# Patient Record
Sex: Male | Born: 1935 | Race: White | Hispanic: No | Marital: Married | Smoking: Current some day smoker
Health system: Southern US, Community
[De-identification: ages and names within clinical notes are randomized; demographics above are authoritative.]

## PROBLEM LIST (undated history)

## (undated) DIAGNOSIS — M1990 Unspecified osteoarthritis, unspecified site: Secondary | ICD-10-CM

## (undated) DIAGNOSIS — C801 Malignant (primary) neoplasm, unspecified: Secondary | ICD-10-CM

## (undated) DIAGNOSIS — Z923 Personal history of irradiation: Secondary | ICD-10-CM

## (undated) DIAGNOSIS — I10 Essential (primary) hypertension: Secondary | ICD-10-CM

## (undated) DIAGNOSIS — I739 Peripheral vascular disease, unspecified: Secondary | ICD-10-CM

## (undated) HISTORY — PX: ACHILLES TENDON SURGERY: SHX542

---

## 1997-10-28 ENCOUNTER — Other Ambulatory Visit: Admission: RE | Admit: 1997-10-28 | Discharge: 1997-10-28 | Payer: Self-pay | Admitting: *Deleted

## 1998-06-01 ENCOUNTER — Emergency Department (HOSPITAL_COMMUNITY): Admission: EM | Admit: 1998-06-01 | Discharge: 1998-06-01 | Payer: Self-pay | Admitting: Emergency Medicine

## 1998-11-08 ENCOUNTER — Ambulatory Visit (HOSPITAL_BASED_OUTPATIENT_CLINIC_OR_DEPARTMENT_OTHER): Admission: RE | Admit: 1998-11-08 | Discharge: 1998-11-08 | Payer: Self-pay | Admitting: Plastic Surgery

## 2000-03-07 ENCOUNTER — Encounter: Admission: RE | Admit: 2000-03-07 | Discharge: 2000-03-07 | Payer: Self-pay | Admitting: *Deleted

## 2000-03-07 ENCOUNTER — Encounter: Payer: Self-pay | Admitting: *Deleted

## 2000-11-23 ENCOUNTER — Encounter: Admission: RE | Admit: 2000-11-23 | Discharge: 2000-11-23 | Payer: Self-pay | Admitting: Internal Medicine

## 2000-11-23 ENCOUNTER — Encounter: Payer: Self-pay | Admitting: Internal Medicine

## 2001-05-31 ENCOUNTER — Encounter: Admission: RE | Admit: 2001-05-31 | Discharge: 2001-05-31 | Payer: Self-pay | Admitting: Internal Medicine

## 2001-05-31 ENCOUNTER — Encounter: Payer: Self-pay | Admitting: Internal Medicine

## 2001-08-15 ENCOUNTER — Encounter: Admission: RE | Admit: 2001-08-15 | Discharge: 2001-08-15 | Payer: Self-pay | Admitting: Internal Medicine

## 2001-08-15 ENCOUNTER — Encounter: Payer: Self-pay | Admitting: Internal Medicine

## 2004-11-14 ENCOUNTER — Encounter: Admission: RE | Admit: 2004-11-14 | Discharge: 2004-11-14 | Payer: Self-pay | Admitting: Internal Medicine

## 2011-03-11 ENCOUNTER — Emergency Department (HOSPITAL_COMMUNITY): Payer: Medicare Other

## 2011-03-11 ENCOUNTER — Inpatient Hospital Stay (HOSPITAL_COMMUNITY): Payer: Medicare Other

## 2011-03-11 ENCOUNTER — Inpatient Hospital Stay (HOSPITAL_COMMUNITY)
Admission: EM | Admit: 2011-03-11 | Discharge: 2011-03-16 | DRG: 603 | Disposition: A | Discharge status: Skilled Nursing Facility | Payer: Medicare Other | Attending: Internal Medicine | Admitting: Internal Medicine

## 2011-03-11 DIAGNOSIS — M25559 Pain in unspecified hip: Secondary | ICD-10-CM | POA: Diagnosis present

## 2011-03-11 DIAGNOSIS — F17200 Nicotine dependence, unspecified, uncomplicated: Secondary | ICD-10-CM | POA: Diagnosis present

## 2011-03-11 DIAGNOSIS — I10 Essential (primary) hypertension: Secondary | ICD-10-CM | POA: Diagnosis present

## 2011-03-11 DIAGNOSIS — I739 Peripheral vascular disease, unspecified: Secondary | ICD-10-CM | POA: Diagnosis present

## 2011-03-11 DIAGNOSIS — I872 Venous insufficiency (chronic) (peripheral): Secondary | ICD-10-CM | POA: Diagnosis present

## 2011-03-11 DIAGNOSIS — L03119 Cellulitis of unspecified part of limb: Principal | ICD-10-CM | POA: Diagnosis present

## 2011-03-11 DIAGNOSIS — D72829 Elevated white blood cell count, unspecified: Secondary | ICD-10-CM | POA: Diagnosis present

## 2011-03-11 DIAGNOSIS — L97309 Non-pressure chronic ulcer of unspecified ankle with unspecified severity: Secondary | ICD-10-CM | POA: Diagnosis present

## 2011-03-11 DIAGNOSIS — N179 Acute kidney failure, unspecified: Secondary | ICD-10-CM | POA: Diagnosis present

## 2011-03-11 DIAGNOSIS — H3530 Unspecified macular degeneration: Secondary | ICD-10-CM | POA: Diagnosis present

## 2011-03-11 DIAGNOSIS — Z79899 Other long term (current) drug therapy: Secondary | ICD-10-CM

## 2011-03-11 DIAGNOSIS — E876 Hypokalemia: Secondary | ICD-10-CM | POA: Diagnosis present

## 2011-03-11 DIAGNOSIS — L02419 Cutaneous abscess of limb, unspecified: Principal | ICD-10-CM | POA: Diagnosis present

## 2011-03-11 LAB — BASIC METABOLIC PANEL
BUN: 59 mg/dL — ABNORMAL HIGH (ref 6–23)
CO2: 25 mEq/L (ref 19–32)
Calcium: 10.9 mg/dL — ABNORMAL HIGH (ref 8.4–10.5)
Chloride: 99 mEq/L (ref 96–112)
Creatinine, Ser: 1.62 mg/dL — ABNORMAL HIGH (ref 0.50–1.35)
Glucose, Bld: 91 mg/dL (ref 70–99)
Potassium: 4.1 mEq/L (ref 3.5–5.1)
Sodium: 136 mEq/L (ref 135–145)

## 2011-03-11 LAB — URINALYSIS, ROUTINE W REFLEX MICROSCOPIC
Bilirubin Urine: NEGATIVE
Glucose, UA: NEGATIVE mg/dL
Specific Gravity, Urine: 1.019 (ref 1.005–1.030)
Urobilinogen, UA: 1 mg/dL (ref 0.0–1.0)
pH: 7.5 (ref 5.0–8.0)

## 2011-03-11 LAB — URINE MICROSCOPIC-ADD ON

## 2011-03-11 LAB — CBC
HCT: 38.6 % — ABNORMAL LOW (ref 39.0–52.0)
Hemoglobin: 13.3 g/dL (ref 13.0–17.0)
MCH: 34.5 pg — ABNORMAL HIGH (ref 26.0–34.0)
MCHC: 34.5 g/dL (ref 30.0–36.0)
RBC: 3.86 MIL/uL — ABNORMAL LOW (ref 4.22–5.81)
WBC: 22.6 10*3/uL — ABNORMAL HIGH (ref 4.0–10.5)

## 2011-03-11 LAB — DIFFERENTIAL
Basophils Absolute: 0 10*3/uL (ref 0.0–0.1)
Eosinophils Relative: 0 % (ref 0–5)
Lymphocytes Relative: 3 % — ABNORMAL LOW (ref 12–46)
Lymphs Abs: 0.7 10*3/uL (ref 0.7–4.0)
Monocytes Relative: 4 % (ref 3–12)
WBC Morphology: INCREASED

## 2011-03-12 LAB — CBC
MCH: 34.9 pg — ABNORMAL HIGH (ref 26.0–34.0)
MCV: 99.2 fL (ref 78.0–100.0)
Platelets: 161 10*3/uL (ref 150–400)
RBC: 3.67 MIL/uL — ABNORMAL LOW (ref 4.22–5.81)
RDW: 13.7 % (ref 11.5–15.5)
WBC: 24.6 10*3/uL — ABNORMAL HIGH (ref 4.0–10.5)

## 2011-03-12 LAB — DIFFERENTIAL
Basophils Absolute: 0 10*3/uL (ref 0.0–0.1)
Eosinophils Absolute: 0 10*3/uL (ref 0.0–0.7)
Lymphocytes Relative: 6 % — ABNORMAL LOW (ref 12–46)
Monocytes Relative: 3 % (ref 3–12)
Neutro Abs: 22.4 10*3/uL — ABNORMAL HIGH (ref 1.7–7.7)
WBC Morphology: INCREASED

## 2011-03-12 LAB — PROCALCITONIN: Procalcitonin: 20.99 ng/mL

## 2011-03-13 ENCOUNTER — Inpatient Hospital Stay (HOSPITAL_COMMUNITY): Payer: Medicare Other

## 2011-03-13 LAB — CBC
MCH: 34.5 pg — ABNORMAL HIGH (ref 26.0–34.0)
MCHC: 34.6 g/dL (ref 30.0–36.0)
Platelets: 166 10*3/uL (ref 150–400)
RBC: 3.57 MIL/uL — ABNORMAL LOW (ref 4.22–5.81)

## 2011-03-13 LAB — BASIC METABOLIC PANEL
Calcium: 10.4 mg/dL (ref 8.4–10.5)
GFR calc Af Amer: 46 mL/min — ABNORMAL LOW (ref >90)
GFR calc non Af Amer: 40 mL/min — ABNORMAL LOW (ref >90)
Potassium: 3 mEq/L — ABNORMAL LOW (ref 3.5–5.1)
Sodium: 138 mEq/L (ref 135–145)

## 2011-03-14 ENCOUNTER — Inpatient Hospital Stay (HOSPITAL_COMMUNITY): Payer: Medicare Other

## 2011-03-14 LAB — URINALYSIS, ROUTINE W REFLEX MICROSCOPIC
Bilirubin Urine: NEGATIVE
Leukocytes, UA: NEGATIVE
Nitrite: NEGATIVE
Specific Gravity, Urine: 1.017 (ref 1.005–1.030)
Urobilinogen, UA: 0.2 mg/dL (ref 0.0–1.0)

## 2011-03-14 LAB — URINE MICROSCOPIC-ADD ON

## 2011-03-14 LAB — BASIC METABOLIC PANEL
CO2: 26 mEq/L (ref 19–32)
Calcium: 10.7 mg/dL — ABNORMAL HIGH (ref 8.4–10.5)
Creatinine, Ser: 1.19 mg/dL (ref 0.50–1.35)

## 2011-03-14 LAB — CBC
MCH: 34.1 pg — ABNORMAL HIGH (ref 26.0–34.0)
MCHC: 34.6 g/dL (ref 30.0–36.0)
MCV: 98.7 fL (ref 78.0–100.0)
Platelets: 175 10*3/uL (ref 150–400)
RBC: 3.72 MIL/uL — ABNORMAL LOW (ref 4.22–5.81)

## 2011-03-14 NOTE — H&P (Signed)
NAMEFROILAN, [name] NO.:  [id number]  MEDICAL RECORD NO.:  [id number]  LOCATION:  [facility]                         FACILITY:  [facility]  PHYSICIAN:  Candelaria Celeste, DO      DATE OF BIRTH:  05/20/1917  DATE OF ADMISSION:  03/11/2011 DATE OF DISCHARGE:                             HISTORY & PHYSICAL   PRIMARY CARE PROVIDER:  Dr. Ludwig Clarks.  CHIEF COMPLAINT:  Left hip pain and left ankle wound.  HISTORY OF PRESENT ILLNESS:  Mr. [name] is a 77 year old male with history of osteopenia, hypertension, chronic venous stasis with a chronic left wound who fell this morning while walking with his wife's walker.  The patient has had difficulty ambulating due to his pain associated with his left ankle wound.  The patient had a mechanical fall and fell on his backside.  He states that he was having difficulty standing up after the fall.  He was specifically complaining of left hip plain.  He denies radiation of the pain.  Currently, the patient admits to mild left discomfort while at rest.  He was able to stand up and put mild pressure on his left leg, although was not able to put full weight due to pain in his left lower leg associated with his chronic left leg wound.  He denied pain to palpation of his left hip.  There is no radiation of his pain.  He denies striking any other part of his body and denies any wounds or bleeding.  In regards to his left lower extremity wound, he has a left medial ankle wound that has been there for several years.  He was recently put on antibiotics yesterday for concerns of cellulitis and wound infection. The patient has had 1 dose.  He denies fevers, shaking chills, nausea, vomiting, worsening of the leg wound.  PAST MEDICAL HISTORY: 1. Hypertension. 2. Chronic venous stasis. 3. Chronic leg wound.  FAMILY HISTORY:  Noncontributory.  MEDICATIONS: 1. Multivitamin. 2. Fish oil. 3. Tylenol extra-strength 1 capsule every 4 hours as  needed. 4. Brimonidine 0.15% one drop to both eyes every 12 hours. 5. Latanoprost 0.005% one drop both eyes daily. 6. Cefixime 400 mg 1 tablet p.o. daily. 7. Niaspan 1000 mg 1 tab p.o. daily. 8. Hydrochlorothiazide 12 mg 1 tab p.o. daily. 9. Furosemide 40 mg 1 tab p.o. daily. 10.Hydralazine 10 mg 1 tab twice daily. 11.Spironolactone 25 mg daily. 12.Amlodipine/benazepril 10/40 mg 1 tab daily.  ALLERGIES:  No known drug allergies.  SOCIAL HISTORY:  The patient is a pipe smoker and currently smokes.  The patient has 1 glass of red wine at night.  REVIEW OF SYSTEMS:  Review of systems is as stated above in the HPI, otherwise negative for 10 systems.  PHYSICAL EXAMINATION:  VITAL SIGNS:  Temperature 99.1, heart rate 86, respiratory rate 20, blood pressure 147/69. GENERAL:  This is an elderly Caucasian male who is awake, alert, and oriented x3.  No acute distress. HEENT:  Head is normocephalic, atraumatic.  Pupils equal, round, and reactive to light.  Extraocular muscles are intact.  Sclerae are anicteric.  Tympanic membranes are translucent with good cone of light. Oropharynx is nonerythematous.  There are no mucosal lesions. NECK:  Supple without lymphadenopathy. LUNGS:  Clear to auscultation bilaterally.  No wheezes, rales, or rhonchi. CARDIAC:  Regular rate with a 1+ systolic murmur heard best over the pulmonary valve.  There is no jugular venous distention.  There is no carotid bruits auscultated. ABDOMEN:  Soft, nontender, nondistended with appropriate bowel sounds. VASCULAR:  2+ radial pulses.  The extremities are warm to touch into the lower extremities bilaterally secondary to chronic venous stasis changes. SKIN:  There is a 1 cm x 2 cm ulceration with flaking necrotic tissue seen on the left medial ankle with flaking skin surrounding it.  There is no erythema noted. NEURO:  Strength is 4/5 in the left hip secondary to pain in the left ankle.  The patient has difficulty  standing secondary to left ankle pain.  LABORATORY DATA: 1. CMP was significant for BUN of 59 and a creatinine of 1.62. 2. CBC showed a white count of 22.6 and 93% neutrophils. 3. Urinalysis was normal. 4. CT of the left hip showed no evidence of fracture. 5. Chest x-ray was negative for acute process.  IMPRESSION: 1. Left hip pain secondary to mechanical fall with no evidence of     fracture. 2. Left ankle wound. 3. Leukocytosis. 4. Acute renal insufficiency. 5. Hypertension. 6. Chronic venous stasis.  PLAN:  We will admit the patient to medical surgical floor.  At this time, despite the patient's negative hip x-ray and negative hip CT, the patient is having some mild-to-moderate pain, we will treat the patient with fentanyl IV 25 mcg every 2 hours as needed, as well as Vicodin for mild-to-moderate pain.  We will also obtain a physical therapy and occupational therapy consult to assess the patient's ability to ambulate.  In regards to the patient's left ankle wound, it has an acute on chronic appearance.  The patient's leukocytosis may be due to smoldering infection in the wound.  We will continue the patient on cefixime as the patient has not been on this medication long enough to evaluate effectiveness or not.  We will get a wound consult to aide in management of the patient's wound.  I will obtain a x- ray of the left lower extremity to rule out osteomyelitis specifically as this appears chronic in nature.  We will continue to monitor the patient's CBC and electrolytes.  The patient is a full code.  We will start the patient on enoxaparin for DVT prophylaxis.          ______________________________ Candelaria Celeste, DO     JS/MEDQ  D:  03/11/2011  T:  03/11/2011  Job:  [id number]  cc:   Dr. Ludwig Clarks  Electronically Signed by Candelaria Celeste DO on 03/14/2011 03:16:20 PM

## 2011-03-16 LAB — COMPREHENSIVE METABOLIC PANEL
Alkaline Phosphatase: 71 U/L (ref 39–117)
BUN: 28 mg/dL — ABNORMAL HIGH (ref 6–23)
Calcium: 10.2 mg/dL (ref 8.4–10.5)
GFR calc Af Amer: 84 mL/min — ABNORMAL LOW (ref >90)
GFR calc non Af Amer: 73 mL/min — ABNORMAL LOW (ref >90)
Glucose, Bld: 116 mg/dL — ABNORMAL HIGH (ref 70–99)
Potassium: 3.7 mEq/L (ref 3.5–5.1)
Total Protein: 6 g/dL (ref 6.0–8.3)

## 2011-03-16 LAB — MAGNESIUM: Magnesium: 1.5 mg/dL (ref 1.5–2.5)

## 2011-03-16 LAB — CBC
HCT: 34.9 % — ABNORMAL LOW (ref 39.0–52.0)
Hemoglobin: 12.1 g/dL — ABNORMAL LOW (ref 13.0–17.0)
MCH: 34.3 pg — ABNORMAL HIGH (ref 26.0–34.0)
MCHC: 34.7 g/dL (ref 30.0–36.0)

## 2011-03-16 LAB — DIFFERENTIAL
Eosinophils Relative: 2 % (ref 0–5)
Lymphs Abs: 1.8 10*3/uL (ref 0.7–4.0)
Monocytes Relative: 7 % (ref 3–12)
Neutro Abs: 8.6 10*3/uL — ABNORMAL HIGH (ref 1.7–7.7)

## 2011-03-18 LAB — CULTURE, BLOOD (ROUTINE X 2)
Culture  Setup Time: 201210281711
Culture: NO GROWTH

## 2011-03-19 LAB — CULTURE, BLOOD (ROUTINE X 2)

## 2011-03-31 NOTE — Discharge Summary (Signed)
  NAMEILAI, HILLER NO.:  [id number]  MEDICAL RECORD NO.:  [id number]  LOCATION:  5501                         FACILITY:  [facility]  PHYSICIAN:  Baltazar Najjar, MD     DATE OF BIRTH:  May 29, 1917  DATE OF ADMISSION:  03/11/2011 DATE OF DISCHARGE:                              DISCHARGE SUMMARY   ADDENDUM:  Blood culture was sent during this hospitalization, remained negative so far.  However, this is a preliminary report.  Final report will need to be followed, defer that to PCP.          ______________________________ Baltazar Najjar, MD     SA/MEDQ  D:  03/16/2011  T:  03/16/2011  Job:  [id number]  cc:   DR. Louretta Parma, MD  Electronically Signed by Hannah Beat MD on 03/31/2011 04:51:11 PM

## 2011-03-31 NOTE — Discharge Summary (Signed)
[name], BUZBY NO.:  [id number]  MEDICAL RECORD NO.:  [id number]  LOCATION:  5501                         FACILITY:  [facility]  PHYSICIAN:  Baltazar Najjar, MD     DATE OF BIRTH:  May 24, 1917  DATE OF ADMISSION:  03/11/2011 DATE OF DISCHARGE:                              DISCHARGE SUMMARY   FINAL DISCHARGE DIAGNOSES: 1. Left ankle ulcers. 2. Chronic bilateral venous stasis. 3. Status post fall. 4. Left hip pain secondary to mechanical fall with no evidence of     fracture. 5. Acute kidney injury, resolved. 6. Hypokalemia, resolved. 7. Leukocytosis, improving. 8. Hypertension.  CONSULTATIONS:  Orthopedic Service.  RADIOLOGY/IMAGING STUDIES: 1. Left hip x-ray showed no acute fracture or subluxation.  Diffuse     osteopenia. 2. Chest x-ray showed no acute infiltrate or edema.  Probable chronic     mild interstitial prominence.  No gross fractures identified. 3. Left hip CT showed no evidence of fracture or dislocation. 4. Left tibia/fibula x-ray showed no acute osseous abnormality     identified about the left tibia and fibula. 5. Chest x-ray October 30 showed stable compared with previous exam. 6. Left foot MRI October 30 showed medial erosion of the head of the     first metatarsal with a borderline appearance for the first     metatarsal joint effusion and a small erosion of the medial     sesamoid.  Gout could cause this appearance.  Erosive arthropathy     such as rheumatoid arthropathy is the differential diagnostic     consideration.  Although there is a small effusion which could go     along with septic joints, the findings are felt to be less     characteristic of septic joint/osteoarthritis. 7. Abnormal subcutaneous edema noted along the dorsum of the foot with     mild edema tracking in the plantar musculature of the foot     cellulitis/myositis cannot be excluded. 8. Degenerative appearing arthropathy along the Lisfranc joint but  without Lisfranc joint subluxation or fracture.  BRIEF ADMITTING HISTORY:  Please refer to H and P for more details.  On summary, [name] is a 77 year old pleasant man with multiple comorbidities presented with left hip pain after he sustained a mechanical fall.  Please refer to H and P for more details.  HOSPITAL COURSE: 1. Left hip pain secondary to mechanical fall.  X-rays/CT of the left     hip showed no evidence of fracture.  The patient was on Lortab     p.r.n. for pain control.  He was seen by PT and recommendation to     discharge to skilled nursing facility. 2. Chronic left ankle wound.  The patient was followed by wound care     consult.  Orthopedic Service was also consulted.  MRI did not show     evidence of osteomyelitis or abscess.  There was suspicion about     possible gout arthropathy.  His uric acid was checked that was     within normal range.  Given leukocytosis, the patient was covered     with Zosyn and vancomycin.  He received 5 days IV.  His white count  dropped from 22,000 to 11.5.  I will discharge him on 5 more days     of Keflex to complete 10 days of antibiotic.  The patient will     follow with Dr. Lajoyce Corners in 1 week as per orthopedic recommendations. 3. Acute kidney injury, resolved.  The patient was on benazepril,     Lasix, Aldactone, and hydrochlorothiazide.  Lasix and benazepril     and hydrochlorothiazide were held.  Initially his creatinine     retained to his baseline, which is normal.  So his acute kidney     injury was most likely secondary to prerenal azotemia. 4. Hypokalemia that was repleted.  The patient was supplemented with     potassium chloride 20 mEq b.i.d.  I will discharge him on potassium     chloride 20 mEq daily.  Since restarting his benazepril and he is     already on Aldactone, his potassium might increase.  He will need     close monitoring of potassium level to see the need for     continuation of potassium  supplementation.  I will defer that to     the rounding physician at the nursing facility and to his PCP.  The     patient will be discharged also on Lasix, so his potassium level     will need to be monitored and potassium dose to be adjusted. 5. Hypertension.  The patient had transient hypotension during this     hospitalization.  He is currently stabilized.  His blood pressure     today is 131/67.  We are resuming his antihypertensive medication     including amlodipine/benazepril.  He is also on Aldactone and     Lasix.  We will hold his HCTZ on discharge. 6. Chronic bilateral venous status changes/left ankle wound.  The     patient will need to continue wound care as an outpatient. 7. The patient seen and examined by me today.  He denies any     complaints, eager for discharge to rehab to get stronger.  DISCHARGE VITAL SIGNS:  Blood pressure 131/67, pulse 58, temperature 97.2.  The patient is stable medically for discharge to skilled nursing facility.  DISCHARGE MEDICATIONS: 1. Hydrocodone/APAP 5/325 mg tablets, 1-2 tablets p.o. q.8 hours     p.r.n. to take for 5 more days as needed. 2. Potassium chloride 20 mEq tablet, take 1 tablet p.o. daily as     instructed by PCP. 3. Keflex 500 mg tablet, take 1 tab p.o. twice daily for 5 more days. 4. Amlodipine/benazepril 10/40 mg 1 tablet p.o. daily. 5. Lasix 40 mg 1 tablet p.o. daily. 6. Latanoprost 0.005% ophthalmic 1 drop in both eyes daily. 7. Multivitamin 1 capsule p.o. daily. 8. Niaspan 1000 mg 1 tablet p.o. daily. 9. Spironolactone 25 mg 1 tablet p.o. daily. 10.Tylenol 500 mg 1 capsule p.o. q.4 hours p.r.n. 11.Brimonidine 0.15%, 1 drop both eyes q.12 hours. 12.Fish oil 1 capsule p.o. daily.  DISCHARGE INSTRUCTIONS: 1. The patient to continue above medications as prescribed. 2. Wound care to be arranged as an outpatient. 3. The patient to follow with Dr. Lajoyce Corners in [facility]     in 1 week of discharge and to  follow with his PCP within 1-2 weeks     of discharge. 4. Monitor potassium level and adjust potassium supplementation.     Deferred to PCP. 5. I will also hold his hydralazine given his transient hypotension.     He  will be discharged on     amlodipine/benazepril, Aldactone, and Lasix.  Hydralazine and HCTZ     will be held.  Please monitor his blood pressure regularly and     adjust medicines as needed.  CONDITION ON DISCHARGE:  Stable.          ______________________________ Baltazar Najjar, MD     SA/MEDQ  D:  03/16/2011  T:  03/16/2011  Job:  [id number]  cc:   Dr. Louretta Parma, MD  Electronically Signed by Hannah Beat MD on 03/31/2011 04:51:04 PM

## 2013-08-22 ENCOUNTER — Emergency Department (HOSPITAL_COMMUNITY): Payer: Medicare Other

## 2013-08-22 ENCOUNTER — Encounter (HOSPITAL_COMMUNITY): Payer: Self-pay | Admitting: Oncology

## 2013-08-22 ENCOUNTER — Inpatient Hospital Stay (HOSPITAL_COMMUNITY)
Admission: EM | Admit: 2013-08-22 | Discharge: 2013-08-24 | DRG: 864 | Disposition: A | Discharge status: Home Health | Payer: Medicare Other | Attending: Internal Medicine | Admitting: Internal Medicine

## 2013-08-22 DIAGNOSIS — R5381 Other malaise: Secondary | ICD-10-CM | POA: Diagnosis present

## 2013-08-22 DIAGNOSIS — R350 Frequency of micturition: Secondary | ICD-10-CM | POA: Diagnosis present

## 2013-08-22 DIAGNOSIS — R300 Dysuria: Secondary | ICD-10-CM | POA: Diagnosis present

## 2013-08-22 DIAGNOSIS — I10 Essential (primary) hypertension: Secondary | ICD-10-CM | POA: Diagnosis present

## 2013-08-22 DIAGNOSIS — Z79899 Other long term (current) drug therapy: Secondary | ICD-10-CM

## 2013-08-22 DIAGNOSIS — R5383 Other fatigue: Secondary | ICD-10-CM

## 2013-08-22 DIAGNOSIS — L02419 Cutaneous abscess of limb, unspecified: Secondary | ICD-10-CM | POA: Diagnosis present

## 2013-08-22 DIAGNOSIS — L03119 Cellulitis of unspecified part of limb: Secondary | ICD-10-CM

## 2013-08-22 DIAGNOSIS — Z8551 Personal history of malignant neoplasm of bladder: Secondary | ICD-10-CM

## 2013-08-22 DIAGNOSIS — E785 Hyperlipidemia, unspecified: Secondary | ICD-10-CM | POA: Diagnosis present

## 2013-08-22 DIAGNOSIS — M549 Dorsalgia, unspecified: Secondary | ICD-10-CM | POA: Diagnosis present

## 2013-08-22 DIAGNOSIS — I872 Venous insufficiency (chronic) (peripheral): Secondary | ICD-10-CM | POA: Diagnosis present

## 2013-08-22 DIAGNOSIS — R509 Fever, unspecified: Secondary | ICD-10-CM

## 2013-08-22 DIAGNOSIS — A419 Sepsis, unspecified organism: Secondary | ICD-10-CM

## 2013-08-22 DIAGNOSIS — R7881 Bacteremia: Secondary | ICD-10-CM

## 2013-08-22 DIAGNOSIS — D72829 Elevated white blood cell count, unspecified: Secondary | ICD-10-CM

## 2013-08-22 DIAGNOSIS — R531 Weakness: Secondary | ICD-10-CM

## 2013-08-22 DIAGNOSIS — R109 Unspecified abdominal pain: Secondary | ICD-10-CM | POA: Diagnosis present

## 2013-08-22 HISTORY — DX: Malignant (primary) neoplasm, unspecified: C80.1

## 2013-08-22 HISTORY — DX: Unspecified osteoarthritis, unspecified site: M19.90

## 2013-08-22 HISTORY — DX: Essential (primary) hypertension: I10

## 2013-08-22 LAB — URINALYSIS, ROUTINE W REFLEX MICROSCOPIC
BILIRUBIN URINE: NEGATIVE
GLUCOSE, UA: NEGATIVE mg/dL
Hgb urine dipstick: NEGATIVE
Ketones, ur: NEGATIVE mg/dL
LEUKOCYTES UA: NEGATIVE
NITRITE: NEGATIVE
PH: 8 (ref 5.0–8.0)
Protein, ur: NEGATIVE mg/dL
SPECIFIC GRAVITY, URINE: 1.016 (ref 1.005–1.030)
Urobilinogen, UA: 1 mg/dL (ref 0.0–1.0)

## 2013-08-22 LAB — CBC
HEMATOCRIT: 32.6 % — AB (ref 39.0–52.0)
HEMOGLOBIN: 11 g/dL — AB (ref 13.0–17.0)
MCH: 32.8 pg (ref 26.0–34.0)
MCHC: 33.7 g/dL (ref 30.0–36.0)
MCV: 97.3 fL (ref 78.0–100.0)
Platelets: 191 10*3/uL (ref 150–400)
RBC: 3.35 MIL/uL — AB (ref 4.22–5.81)
RDW: 14.7 % (ref 11.5–15.5)
WBC: 23.5 10*3/uL — ABNORMAL HIGH (ref 4.0–10.5)

## 2013-08-22 LAB — COMPREHENSIVE METABOLIC PANEL
ALK PHOS: 97 U/L (ref 39–117)
ALT: 14 U/L (ref 0–53)
AST: 24 U/L (ref 0–37)
Albumin: 3.1 g/dL — ABNORMAL LOW (ref 3.5–5.2)
BILIRUBIN TOTAL: 0.6 mg/dL (ref 0.3–1.2)
BUN: 23 mg/dL (ref 6–23)
CHLORIDE: 99 meq/L (ref 96–112)
CO2: 24 meq/L (ref 19–32)
Calcium: 9.4 mg/dL (ref 8.4–10.5)
Creatinine, Ser: 1.29 mg/dL (ref 0.50–1.35)
GFR, EST AFRICAN AMERICAN: 53 mL/min — AB (ref >90)
GFR, EST NON AFRICAN AMERICAN: 46 mL/min — AB (ref >90)
GLUCOSE: 138 mg/dL — AB (ref 70–99)
POTASSIUM: 4.2 meq/L (ref 3.7–5.3)
SODIUM: 134 meq/L — AB (ref 137–147)
Total Protein: 6.4 g/dL (ref 6.0–8.3)

## 2013-08-22 LAB — I-STAT CG4 LACTIC ACID, ED: LACTIC ACID, VENOUS: 1.51 mmol/L (ref 0.5–2.2)

## 2013-08-22 LAB — TSH: TSH: 1.03 u[IU]/mL (ref 0.350–4.500)

## 2013-08-22 MED ORDER — ENOXAPARIN SODIUM 40 MG/0.4ML ~~LOC~~ SOLN
40.0000 mg | SUBCUTANEOUS | Status: DC
  Administered 2013-08-23: 40 mg via SUBCUTANEOUS
  Filled 2013-08-22 (×2): qty 0.4

## 2013-08-22 MED ORDER — SODIUM CHLORIDE 0.9 % IV SOLN
INTRAVENOUS | Status: DC
  Administered 2013-08-22: via INTRAVENOUS

## 2013-08-22 MED ORDER — OMEGA-3-ACID ETHYL ESTERS 1 G PO CAPS
1.0000 g | ORAL_CAPSULE | Freq: Every day | ORAL | Status: DC
  Administered 2013-08-23 – 2013-08-24 (×2): 1 g via ORAL
  Filled 2013-08-22 (×2): qty 1

## 2013-08-22 MED ORDER — ACETAMINOPHEN 500 MG PO TABS
1000.0000 mg | ORAL_TABLET | Freq: Once | ORAL | Status: AC
  Administered 2013-08-22: 1000 mg via ORAL
  Filled 2013-08-22: qty 2

## 2013-08-22 MED ORDER — SODIUM CHLORIDE 0.9 % IV BOLUS (SEPSIS)
500.0000 mL | Freq: Once | INTRAVENOUS | Status: AC
  Administered 2013-08-22: 500 mL via INTRAVENOUS

## 2013-08-22 MED ORDER — ONDANSETRON HCL 4 MG/2ML IJ SOLN: 4.0000 mg | Freq: Four times a day (QID) | INTRAMUSCULAR | Status: DC | PRN

## 2013-08-22 MED ORDER — SENNOSIDES-DOCUSATE SODIUM 8.6-50 MG PO TABS: 1.0000 | ORAL_TABLET | Freq: Every evening | ORAL | Status: DC | PRN

## 2013-08-22 MED ORDER — SODIUM CHLORIDE 0.9 % IV BOLUS (SEPSIS): 1000.0000 mL | Freq: Once | INTRAVENOUS | Status: DC

## 2013-08-22 MED ORDER — ACETAMINOPHEN 650 MG RE SUPP: 650.0000 mg | Freq: Four times a day (QID) | RECTAL | Status: DC | PRN

## 2013-08-22 MED ORDER — LATANOPROST 0.005 % OP SOLN
1.0000 [drp] | Freq: Every day | OPHTHALMIC | Status: DC
  Administered 2013-08-23 (×2): 1 [drp] via OPHTHALMIC
  Filled 2013-08-22: qty 2.5

## 2013-08-22 MED ORDER — OXYCODONE HCL 5 MG PO TABS
5.0000 mg | ORAL_TABLET | ORAL | Status: DC | PRN
  Administered 2013-08-23 (×2): 5 mg via ORAL
  Filled 2013-08-22 (×2): qty 1

## 2013-08-22 MED ORDER — VANCOMYCIN HCL IN DEXTROSE 1-5 GM/200ML-% IV SOLN
1000.0000 mg | Freq: Once | INTRAVENOUS | Status: AC
  Administered 2013-08-22: 1000 mg via INTRAVENOUS
  Filled 2013-08-22: qty 200

## 2013-08-22 MED ORDER — SODIUM CHLORIDE 0.9 % IV SOLN
INTRAVENOUS | Status: AC
  Administered 2013-08-22: 18:00:00 via INTRAVENOUS
  Administered 2013-08-22: 500 mL via INTRAVENOUS

## 2013-08-22 MED ORDER — PNEUMOCOCCAL VAC POLYVALENT 25 MCG/0.5ML IJ INJ
0.5000 mL | INJECTION | INTRAMUSCULAR | Status: AC
  Administered 2013-08-23: 0.5 mL via INTRAMUSCULAR
  Filled 2013-08-22 (×2): qty 0.5

## 2013-08-22 MED ORDER — ONDANSETRON HCL 4 MG PO TABS: 4.0000 mg | ORAL_TABLET | Freq: Four times a day (QID) | ORAL | Status: DC | PRN

## 2013-08-22 MED ORDER — CLOPIDOGREL BISULFATE 75 MG PO TABS
75.0000 mg | ORAL_TABLET | Freq: Every day | ORAL | Status: DC
  Administered 2013-08-23 – 2013-08-24 (×2): 75 mg via ORAL
  Filled 2013-08-22 (×3): qty 1

## 2013-08-22 MED ORDER — PIPERACILLIN-TAZOBACTAM 3.375 G IVPB 30 MIN
3.3750 g | Freq: Once | INTRAVENOUS | Status: AC
  Administered 2013-08-22: 3.375 g via INTRAVENOUS
  Filled 2013-08-22: qty 50

## 2013-08-22 MED ORDER — ACETAMINOPHEN 325 MG PO TABS
650.0000 mg | ORAL_TABLET | Freq: Four times a day (QID) | ORAL | Status: DC | PRN
  Administered 2013-08-23: 650 mg via ORAL
  Filled 2013-08-22: qty 2

## 2013-08-22 MED ORDER — BRIMONIDINE TARTRATE 0.15 % OP SOLN
1.0000 [drp] | Freq: Two times a day (BID) | OPHTHALMIC | Status: DC
  Administered 2013-08-23 – 2013-08-24 (×4): 1 [drp] via OPHTHALMIC
  Filled 2013-08-22: qty 5

## 2013-08-22 MED ORDER — NIACIN ER (ANTIHYPERLIPIDEMIC) 500 MG PO TBCR
1000.0000 mg | EXTENDED_RELEASE_TABLET | Freq: Every day | ORAL | Status: DC
  Administered 2013-08-23 (×2): 1000 mg via ORAL
  Filled 2013-08-22 (×3): qty 2

## 2013-08-22 NOTE — ED Notes (Addendum)
Per EMS patient from home, home caregiver saw patient this morning and patient was "fine," ambulatory, approximately 2 hours ago patient had a fever and was not able to ambulate. Per EMS patient had urinary frequency prior to fever. Per EMS patient is A & O, difficulty hearing. Per EMS patient does not have AMS. Patient fell 2 weeks ago and was in [place] for rehab, for which he still has hip and back pain.

## 2013-08-22 NOTE — Progress Notes (Signed)
   CARE MANAGEMENT ED NOTE 08/22/2013  Patient:  [name], [name]   Account Number:  [id number]  Date Initiated:  08/22/2013  Documentation initiated by:  Livia Snellen  Subjective/Objective Assessment:   Patient presented to Ed with fever and unable to ambulate.     Subjective/Objective Assessment Detail:   WBC 23.5  temp 102.4     Action/Plan:   Action/Plan Detail:   Anticipated DC Date:       Status Recommendation to Physician:   Result of Recommendation:    Other ED [place]  Other  PCP issues    Choice offered to / List presented to:            Status of service:  Completed, signed off  ED Comments:   ED Comments Detail:  EDCM spoke to patient and his daughter Sandria Bales (patient's POA) at bedside.  Gloria's phone number (h) [phone] and (c) [phone].  Patient lives at home alone.  Patient reports, "I'm alone at night."  Patient's daughter reports, patient has an aide from [place] three hours in the am and three hours in the pm who help the patient with meals, ADL's and cleaning the house. Patient has a walker, cane, shower chair and bedside commode at home.  Patient has just been discharged from the [facility] in [place] on 03/21 or 03/23 per patient's daughter.  Patient is receiving home health sevices  RN, PT, OT with [facility] per patient's daughter.  No further EDCM needs at this time.

## 2013-08-22 NOTE — ED Provider Notes (Signed)
CSN: [id number]     Arrival date & time 08/22/13  1518 History   First MD Initiated Contact with Patient 08/22/13 1537     Chief Complaint  Patient presents with  . Fever     (Consider location/radiation/quality/duration/timing/severity/associated sxs/prior Treatment) The history is provided by the patient and a relative. The history is limited by the condition of the patient.  [name] is a 80 y.o. male hx of dementia, recent admission at [facility] for bacteremia here with fever. He was recently a med several weeks ago for bacteremia and went to rehab. He just went home for several days and lives alone at home. A caregiver came today and noticed that he had a fever and hasn't been able to ambulate. He also has urinary frequency as well. He is confused, unable to give much history.   Level V caveat- dementia    No past medical history on file. No past surgical history on file. No family history on file. History  Substance Use Topics  . Smoking status: Not on file  . Smokeless tobacco: Not on file  . Alcohol Use: Not on file    Review of Systems  Constitutional: Positive for fever.  Neurological: Positive for weakness.  All other systems reviewed and are negative.     Allergies  Review of patient's allergies indicates no known allergies.  Home Medications   Current Outpatient Rx  Name  Route  Sig  Dispense  Refill  . amLODipine-benazepril (LOTREL) 10-40 MG per capsule   Oral   Take 1 capsule by mouth daily.         . brimonidine (ALPHAGAN) 0.15 % ophthalmic solution   Both Eyes   Place 1 drop into both eyes 2 (two) times daily.         . clopidogrel (PLAVIX) 75 MG tablet   Oral   Take 75 mg by mouth daily with breakfast.         . finasteride (PROSCAR) 5 MG tablet   Oral   Take 5 mg by mouth daily.         . furosemide (LASIX) 20 MG tablet   Oral   Take 20 mg by mouth daily.         . hydrALAZINE (APRESOLINE) 10 MG tablet   Oral  Take 5 mg by mouth 2 (two) times daily. TAKE 1/2 TABLET         . latanoprost (XALATAN) 0.005 % ophthalmic solution   Both Eyes   Place 1 drop into both eyes at bedtime.         . niacin (NIASPAN) 500 MG CR tablet   Oral   Take 1,000 mg by mouth at bedtime.         Marland Kitchen omega-3 acid ethyl esters (LOVAZA) 1 G capsule   Oral   Take 1 g by mouth daily.         . tamsulosin (FLOMAX) 0.4 MG CAPS capsule   Oral   Take 0.4 mg by mouth daily after supper.         . traMADol (ULTRAM) 50 MG tablet   Oral   Take 50 mg by mouth every 6 (six) hours as needed (PAIN).          BP 112/54  Pulse 90  Temp(Src) 102.4 F (39.1 C) (Rectal)  Resp 18  SpO2 97% Physical Exam  Nursing note and vitals reviewed. Constitutional:  Chronically ill, uncomfortable   HENT:  Head: Normocephalic.  Mouth/Throat:  Oropharynx is clear and moist.  Eyes: Conjunctivae and EOM are normal. Pupils are equal, round, and reactive to light.  Neck: Normal range of motion. Neck supple.  Cardiovascular: Normal rate, regular rhythm and normal heart sounds.   Pulmonary/Chest: Effort normal and breath sounds normal. No respiratory distress. He has no wheezes. He has no rales.  Abdominal: Soft. Bowel sounds are normal. He exhibits no distension. There is no tenderness. There is no rebound.  Musculoskeletal: Normal range of motion.  2+ edema bilateral legs, bilateral calf erythema   Neurological: He is alert.  Confused moving all extremities   Skin: Skin is warm and dry.  Psychiatric:  Unable     ED Course  Procedures (including critical care time) Labs Review Labs Reviewed  CBC - Abnormal; Notable for the following:    WBC 23.5 (*)    RBC 3.35 (*)    Hemoglobin 11.0 (*)    HCT 32.6 (*)    All other components within normal limits  COMPREHENSIVE METABOLIC PANEL - Abnormal; Notable for the following:    Sodium 134 (*)    Glucose, Bld 138 (*)    Albumin 3.1 (*)    GFR calc non Af Amer 46 (*)    GFR  calc Af Amer 53 (*)    All other components within normal limits  URINE CULTURE  CULTURE, BLOOD (ROUTINE X 2)  CULTURE, BLOOD (ROUTINE X 2)  URINALYSIS, ROUTINE W REFLEX MICROSCOPIC  I-STAT CG4 LACTIC ACID, ED   Imaging Review Dg Chest 2 View  08/22/2013   CLINICAL DATA:  Shortness of breath, fever  EXAM: CHEST  2 VIEW  COMPARISON:  DG CHEST 2 VIEW dated 03/14/2011  FINDINGS: Heart size upper normal. The aorta is uncoiled. The vascular pattern is normal. There is mild vascular congestion. There is no consolidation or pleural effusion.  IMPRESSION: Stable mild diffuse interstitial change.   Electronically Signed   By: Skipper Cliche M.D.   On: 08/22/2013 16:24     EKG Interpretation None      MDM   Final diagnoses:  Sepsis  Bacteremia    [name] is a 81 y.o. male here with confusion, fever. Concerned for sepsis, will do sepsis workup. Will likely admit. I doubt meningitis as he is moving his neck very well. Will also try and get records from [place].   5:34 PM WBC 24, CXR UA unremarkable. Given recent bacteremia, will give vanc/zosyn empirically.    Wandra Arthurs, MD 08/22/13 [phone]

## 2013-08-22 NOTE — H&P (Signed)
[facility]          History and Physical    PCP:   [name], MD   Chief Complaint:  Fever, weakness  HPI: Patient is a 80 year old male with past medical history significant for hypertension, hyperlipidemia, remote bladder cancer who had a recent hospitalization in January at [facility] in [place] for some sort of infection that is not clear to the patient's daughter at this time. After that hospitalization he was discharged to a rehabilitation facility and after 6 weeks was sent home where he has been doing well with home health therapies. Today when his home health nurse went to the house he was extremely weak to the point where he cannot get out of bed, had a temperature of 103.9 and 911 was called to bring him to the hospital. He has had a pretty extensive workup while in the emergency department including blood work that is only significant for marked leukocytosis, chest x-ray that is negative, urine analysis that is negative, normal lactic acid, normal LFTs. He was given a dose of vancomycin and Zosyn. Hospitalist admission was requested. Upon conversation with patient and daughter he does not appear confused and daughter attests to this. He denies nausea, vomiting, abdominal pain, diarrhea, cough, shortness of breath, chest pain, runny nose, headache, neck stiffness. His only complaint is dysuria and urinary frequency.  Allergies:  No Known Allergies  PMH: HTN Hyperlipidemia Bladder cancer No past surgical history on file.  Prior to Admission medications   Medication Sig Start Date End Date Taking? Authorizing Provider  amLODipine-benazepril (LOTREL) 10-40 MG per capsule Take 1 capsule by mouth daily.   Yes Historical Provider, MD  brimonidine (ALPHAGAN) 0.15 % ophthalmic solution Place 1 drop into both eyes 2 (two) times daily.   Yes Historical Provider, MD  clopidogrel (PLAVIX) 75 MG tablet Take 75 mg by mouth daily with breakfast.   Yes Historical  Provider, MD  finasteride (PROSCAR) 5 MG tablet Take 5 mg by mouth daily.   Yes Historical Provider, MD  furosemide (LASIX) 20 MG tablet Take 20 mg by mouth daily.   Yes Historical Provider, MD  hydrALAZINE (APRESOLINE) 10 MG tablet Take 5 mg by mouth 2 (two) times daily. TAKE 1/2 TABLET   Yes Historical Provider, MD  latanoprost (XALATAN) 0.005 % ophthalmic solution Place 1 drop into both eyes at bedtime.   Yes Historical Provider, MD  niacin (NIASPAN) 500 MG CR tablet Take 1,000 mg by mouth at bedtime.   Yes Historical Provider, MD  omega-3 acid ethyl esters (LOVAZA) 1 G capsule Take 1 g by mouth daily.   Yes Historical Provider, MD  tamsulosin (FLOMAX) 0.4 MG CAPS capsule Take 0.4 mg by mouth daily after supper.   Yes Historical Provider, MD  traMADol (ULTRAM) 50 MG tablet Take 50 mg by mouth every 6 (six) hours as needed (PAIN).   Yes Historical Provider, MD    Social History:  has no tobacco, alcohol, and drug history on file.  Family History: Noncontributory  Review of Systems:  Constitutional: Positive for fever, chills, diaphoresis, appetite change and fatigue.  HEENT: Denies photophobia, eye pain, redness, hearing loss, ear pain, congestion, sore throat, rhinorrhea, sneezing, mouth sores, trouble swallowing, neck pain, neck stiffness and tinnitus.   Respiratory: Denies SOB, DOE, cough, chest tightness,  and wheezing.   Cardiovascular: Denies chest pain, palpitations and leg swelling.  Gastrointestinal: Denies nausea, vomiting, abdominal pain, diarrhea,  constipation, blood in stool and abdominal distention.  Genitourinary: Denies dysuria, urgency, frequency, hematuria, flank pain and difficulty urinating.  Endocrine: Denies: hot or cold intolerance, sweats, changes in hair or nails, polyuria, polydipsia. Musculoskeletal: Denies myalgias, back pain, joint swelling, arthralgias and gait problem.  Skin: Denies pallor, rash and wound.  Neurological: Denies dizziness, seizures, syncope,  weakness, light-headedness, numbness and headaches.  Hematological: Denies adenopathy. Easy bruising, personal or family bleeding history  Psychiatric/Behavioral: Denies suicidal ideation, mood changes, confusion, nervousness, sleep disturbance and agitation   Physical Exam: Blood pressure 112/54, pulse 90, temperature 102.4 F (39.1 C), temperature source Rectal, resp. rate 18, SpO2 97.00%. General: Alert, awake, oriented x3, hard of hearing. HEENT: Normocephalic, atraumatic, pupils equal round and reactive to light, extraocular movements intact, poor dentition, moist mucous membranes. Neck: Supple, no JVD, no lymphadenopathy, no bruits, no goiter. Cardiovascular: Regular rate and rhythm, no murmurs, rubs or gallops. Lungs: Clear to auscultation bilaterally. Abdomen: Soft, nontender, nondistended, positive bowel sounds. Extremities: No clubbing, cyanosis, positive pedal pulses. 1+ edema bilaterally. Healed venous stasis ulcerations and skin color changes of venous insufficiency. Neurologic: Mental status appears intact, nonfocal, I have not ambulated him.   Labs on Admission:  Results for orders placed during the hospital encounter of 08/22/13 (from the past 48 hour(s))  URINALYSIS, ROUTINE W REFLEX MICROSCOPIC     Status: None   Collection Time    08/22/13  3:42 PM      Result Value Ref Range   Color, Urine YELLOW  YELLOW   APPearance CLEAR  CLEAR   Specific Gravity, Urine 1.016  1.005 - 1.030   pH 8.0  5.0 - 8.0   Glucose, UA NEGATIVE  NEGATIVE mg/dL   Hgb urine dipstick NEGATIVE  NEGATIVE   Bilirubin Urine NEGATIVE  NEGATIVE   Ketones, ur NEGATIVE  NEGATIVE mg/dL   Protein, ur NEGATIVE  NEGATIVE mg/dL   Urobilinogen, UA 1.0  0.0 - 1.0 mg/dL   Nitrite NEGATIVE  NEGATIVE   Leukocytes, UA NEGATIVE  NEGATIVE   Comment: MICROSCOPIC NOT DONE ON URINES WITH NEGATIVE PROTEIN, BLOOD, LEUKOCYTES, NITRITE, OR GLUCOSE <1000 mg/dL.  CBC     Status: Abnormal   Collection Time    08/22/13   3:57 PM      Result Value Ref Range   WBC 23.5 (*) 4.0 - 10.5 K/uL   RBC 3.35 (*) 4.22 - 5.81 MIL/uL   Hemoglobin 11.0 (*) 13.0 - 17.0 g/dL   HCT 84.2 (*) 10.3 - 12.8 %   MCV 97.3  78.0 - 100.0 fL   MCH 32.8  26.0 - 34.0 pg   MCHC 33.7  30.0 - 36.0 g/dL   RDW 11.8  86.7 - 73.7 %   Platelets 191  150 - 400 K/uL  COMPREHENSIVE METABOLIC PANEL     Status: Abnormal   Collection Time    08/22/13  3:57 PM      Result Value Ref Range   Sodium 134 (*) 137 - 147 mEq/L   Potassium 4.2  3.7 - 5.3 mEq/L   Chloride 99  96 - 112 mEq/L   CO2 24  19 - 32 mEq/L   Glucose, Bld 138 (*) 70 - 99 mg/dL   BUN 23  6 - 23 mg/dL   Creatinine, Ser 3.66  0.50 - 1.35 mg/dL   Calcium 9.4  8.4 - 81.5 mg/dL   Total Protein 6.4  6.0 - 8.3 g/dL   Albumin 3.1 (*) 3.5 - 5.2 g/dL   AST  24  0 - 37 U/L   ALT 14  0 - 53 U/L   Alkaline Phosphatase 97  39 - 117 U/L   Total Bilirubin 0.6  0.3 - 1.2 mg/dL   GFR calc non Af Amer 46 (*) >90 mL/min   GFR calc Af Amer 53 (*) >90 mL/min   Comment: (NOTE)     The eGFR has been calculated using the CKD EPI equation.     This calculation has not been validated in all clinical situations.     eGFR's persistently <90 mL/min signify possible Chronic Kidney     Disease.  I-STAT CG4 LACTIC ACID, ED     Status: None   Collection Time    08/22/13  4:15 PM      Result Value Ref Range   Lactic Acid, Venous 1.51  0.5 - 2.2 mmol/L    Radiological Exams on Admission: Dg Chest 2 View  08/22/2013   CLINICAL DATA:  Shortness of breath, fever  EXAM: CHEST  2 VIEW  COMPARISON:  DG CHEST 2 VIEW dated 03/14/2011  FINDINGS: Heart size upper normal. The aorta is uncoiled. The vascular pattern is normal. There is mild vascular congestion. There is no consolidation or pleural effusion.  IMPRESSION: Stable mild diffuse interstitial change.   Electronically Signed   By: Skipper Cliche M.D.   On: 08/22/2013 16:24    Assessment/Plan Principal Problem:   FUO (fever of unknown origin) Active  Problems:   Weakness   Leukocytosis    Fever/weakness/leukocytosis -Etiology remains unclear to me at this time. -No pneumonia on chest x-ray or upper respiratory symptoms that would make me want to rule out influenza. -Urinalysis is negative, despite his only complaints being dysuria and frequency. Will repeat UA in case this is an error. -No confusion or neck stiffness that would make me suspicious of meningitis and warrant a lumbar puncture. -He has chronic venous stasis changes to his legs but nothing that appears to be an acute cellulitis. I have examined his body surface in detail and he does not appear to have any wounds or sores. -He was unfortunately given a dose of vancomycin and Zosyn while in the emergency department. Will withhold further antibiotics as I am not clear what is being treated at this time and antibiotics may alter fever and white blood cell curve as well as culture data. -I have requested ED secretary obtain records from prior hospitalization in [place] from January as it would be interesting to see what type of infection he was treated for. -Blood/urine cultures ordered. -If fever continues, may benefit from an infectious disease consultation for further recommendations. -For his weakness, will ask physical therapy to assess him. Also given his age we'll check a TSH and vitamin B 12.  DVT prophylaxis -Lovenox  CODE STATUS -Full code as discussed with daughter at bedside.   Time Spent on Admission:  110 minutes   [facility] Pager: [phone] 08/22/2013, 6:44 PM

## 2013-08-23 ENCOUNTER — Encounter (HOSPITAL_COMMUNITY): Payer: Self-pay | Admitting: Oncology

## 2013-08-23 ENCOUNTER — Inpatient Hospital Stay (HOSPITAL_COMMUNITY): Payer: Medicare Other

## 2013-08-23 DIAGNOSIS — A419 Sepsis, unspecified organism: Secondary | ICD-10-CM

## 2013-08-23 LAB — URINALYSIS, ROUTINE W REFLEX MICROSCOPIC
Bilirubin Urine: NEGATIVE
GLUCOSE, UA: NEGATIVE mg/dL
Hgb urine dipstick: NEGATIVE
Ketones, ur: NEGATIVE mg/dL
Nitrite: NEGATIVE
Protein, ur: NEGATIVE mg/dL
Specific Gravity, Urine: 1.016 (ref 1.005–1.030)
Urobilinogen, UA: 0.2 mg/dL (ref 0.0–1.0)
pH: 7.5 (ref 5.0–8.0)

## 2013-08-23 LAB — CBC
HCT: 32.5 % — ABNORMAL LOW (ref 39.0–52.0)
Hemoglobin: 10.9 g/dL — ABNORMAL LOW (ref 13.0–17.0)
MCH: 33.1 pg (ref 26.0–34.0)
MCHC: 33.5 g/dL (ref 30.0–36.0)
MCV: 98.8 fL (ref 78.0–100.0)
PLATELETS: 160 10*3/uL (ref 150–400)
RBC: 3.29 MIL/uL — ABNORMAL LOW (ref 4.22–5.81)
RDW: 14.9 % (ref 11.5–15.5)
WBC: 20.1 10*3/uL — ABNORMAL HIGH (ref 4.0–10.5)

## 2013-08-23 LAB — URINE CULTURE: Colony Count: 5000

## 2013-08-23 LAB — BASIC METABOLIC PANEL
BUN: 21 mg/dL (ref 6–23)
CO2: 24 meq/L (ref 19–32)
Calcium: 9.5 mg/dL (ref 8.4–10.5)
Chloride: 101 mEq/L (ref 96–112)
Creatinine, Ser: 1.14 mg/dL (ref 0.50–1.35)
GFR calc Af Amer: 62 mL/min — ABNORMAL LOW (ref >90)
GFR calc non Af Amer: 53 mL/min — ABNORMAL LOW (ref >90)
Glucose, Bld: 91 mg/dL (ref 70–99)
Potassium: 4.5 mEq/L (ref 3.7–5.3)
Sodium: 136 mEq/L — ABNORMAL LOW (ref 137–147)

## 2013-08-23 LAB — INFLUENZA PANEL BY PCR (TYPE A & B)
H1N1 flu by pcr: NOT DETECTED
INFLBPCR: NEGATIVE
Influenza A By PCR: NEGATIVE

## 2013-08-23 LAB — URINE MICROSCOPIC-ADD ON

## 2013-08-23 LAB — SAVE SMEAR

## 2013-08-23 LAB — VITAMIN B12: Vitamin B-12: 595 pg/mL (ref 211–911)

## 2013-08-23 NOTE — Progress Notes (Signed)
CSW referral received for SNF placement.  Chart reviewed.  Noted that PT not recommending SNF.  CSW to sign off.  Please re-consult CSW should d/c needs change.  Bernita Raisin, [place] [facility] [phone]

## 2013-08-23 NOTE — Progress Notes (Signed)
PROGRESS NOTE   Mako Pelfrey Coronado FXT:[id number] DOB: [birth year] DOA: 08/22/2013 PCP: [name], MD   Assessment/Plan:   Fever - patient reports this morning "soreness" all over. He is very HOH and unreliable historian, ?confusion this morning. Per RN he thought he is in the 1950s last night. Check influenza. Will discuss with daughter today re baseline mental status.  - reports left flank pain, renal US   HTN - on hydralazine 5 BID, Lotrel at home.  - hold, in the setting of potential infection, BP wnl this morning  HLD - continue home meds  History of bladder cancer - no hematuria on UA x 2  Leukocytosis - chronic, present as far as 2012. Monitor. Blood smear.    Weakness - PT consult    Diet: Heart Fluids: NS 75 cc/h DVT Prophylaxis: Lovenox  Code Status: Full Family Communication: none this morning  Disposition Plan: inpatient  Consultants:  none  Procedures:  none   Antibiotics Vancomycin/Zosyn - 1 dose in the ED on 4/10  HPI/Subjective: - complains of soreness all over and back pain, mid left sided.   Objective: Filed Vitals:   08/22/13 2023 08/22/13 2200 08/22/13 2236 08/23/13 0505  BP: 114/48  129/55 107/51  Pulse:  53 59 49  Temp:   98.5 F (36.9 C) 97.7 F (36.5 C)  TempSrc:   Oral Oral  Resp:   16 16  Height:   5\' 10"  (1.778 m)   Weight:   76.2 kg (167 lb 15.9 oz)   SpO2:  100% 100% 99%    Intake/Output Summary (Last 24 hours) at 08/23/13 0758 Last data filed at 08/23/13 0030  Gross per 24 hour  Intake      0 ml  Output    402 ml  Net   -402 ml   Filed Weights   08/22/13 2236  Weight: 76.2 kg (167 lb 15.9 oz)   Exam:  General:  NAD  Cardiovascular: regular rate and rhythm, without MRG  Respiratory: good air movement, clear to auscultation throughout, no wheezing, ronchi or rales  Abdomen: soft, not tender to palpation, positive bowel sounds  MSK: chroniv vv stasis changes LE  Neuro: non focal  Data  Reviewed: Basic Metabolic Panel:  Recent Labs Lab 08/22/13 1557  NA 134*  K 4.2  CL 99  CO2 24  GLUCOSE 138*  BUN 23  CREATININE 1.29  CALCIUM 9.4   Liver Function Tests:  Recent Labs Lab 08/22/13 1557  AST 24  ALT 14  ALKPHOS 97  BILITOT 0.6  PROT 6.4  ALBUMIN 3.1*   CBC:  Recent Labs Lab 08/22/13 1557  WBC 23.5*  HGB 11.0*  HCT 32.6*  MCV 97.3  PLT 191   Studies: Dg Chest 2 View  08/22/2013   CLINICAL DATA:  Shortness of breath, fever  EXAM: CHEST  2 VIEW  COMPARISON:  DG CHEST 2 VIEW dated 03/14/2011  FINDINGS: Heart size upper normal. The aorta is uncoiled. The vascular pattern is normal. There is mild vascular congestion. There is no consolidation or pleural effusion.  IMPRESSION: Stable mild diffuse interstitial change.   Electronically Signed   By: Skipper Cliche M.D.   On: 08/22/2013 16:24    Scheduled Meds: . brimonidine  1 drop Both Eyes BID  . clopidogrel  75 mg Oral Q breakfast  . enoxaparin (LOVENOX) injection  40 mg Subcutaneous Q24H  . latanoprost  1 drop Both Eyes QHS  . niacin  1,000 mg Oral QHS  .  omega-3 acid ethyl esters  1 g Oral Daily  . pneumococcal 23 valent vaccine  0.5 mL Intramuscular Tomorrow-1000   Continuous Infusions: . sodium chloride 75 mL/hr at 08/22/13 2330    Principal Problem:   FUO (fever of unknown origin) Active Problems:   Weakness   Leukocytosis  Time spent: 35  This note has been created with Surveyor, quantity. Any transcriptional errors are unintentional.   Marzetta Board, MD [facility] Pager [phone]. If 7 PM - 7 AM, please contact night-coverage at [email or web address], password [facility] 08/23/2013, 7:58 AM  LOS: 1 day

## 2013-08-23 NOTE — Evaluation (Signed)
Physical Therapy Evaluation Patient Details Name: [name] MRN: [id number] DOB: 07-Mar-1918 Today's Date: 08/23/2013   History of Present Illness  Patient is a 80 year old male with past medical history significant for hypertension, hyperlipidemia, remote bladder cancer who had a recent hospitalization in January at [facility] in [place] for some sort of infection that is not clear to the patient's daughter at this time. After that hospitalization he was discharged to a rehabilitation facility and after 6 weeks was sent home where he has been doing well with home health therapies. Today when his home health nurse went to the house he was extremely weak to the point where he cannot get out of bed, had a temperature of 103.9 and 911 was called to bring him to the hospital.  Clinical Impression  Pt appears at baseline level of functioning and is mod I with mobility, supervision for safety with gait.  Pt with no further acute PT needs identified at this time.  Recommend supervision 24 hours at home.    Follow Up Recommendations Supervision/Assistance - 24 hour;No PT follow up    Equipment Recommendations  None recommended by PT    Recommendations for Other Services       Precautions / Restrictions Precautions Precautions: Fall Restrictions Weight Bearing Restrictions: No      Mobility  Bed Mobility Overal bed mobility: Modified Independent                Transfers Overall transfer level: Modified independent                  Ambulation/Gait Ambulation/Gait assistance: Supervision Ambulation Distance (Feet): 150 Feet Assistive device: Rolling walker (2 wheeled)       General Gait Details: normal cadence, narrow BOS, no LOB, forward flexed posture throughout, supervision for safety  Stairs            Wheelchair Mobility    Modified Rankin (Stroke Patients Only)       Balance                                              Pertinent Vitals/Pain No c/o pain    Home Living Family/patient expects to be discharged to:: Private residence Living Arrangements: Children;Spouse/significant other Available Help at Discharge: Family Type of Home: House Home Access: Level entry     Home Layout: One level Home Equipment: Environmental consultant - 2 wheels      Prior Function Level of Independence: Independent with assistive device(s)               Hand Dominance        Extremity/Trunk Assessment               Lower Extremity Assessment: Generalized weakness      Cervical / Trunk Assessment: Kyphotic  Communication   Communication: HOH  Cognition Arousal/Alertness: Awake/alert Behavior During Therapy: Agitated Overall Cognitive Status: No family/caregiver present to determine baseline cognitive functioning                      General Comments      Exercises        Assessment/Plan    PT Assessment Patent does not need any further PT services  PT Diagnosis     PT Problem List    PT Treatment Interventions     PT Goals (Current  goals can be found in the Care Plan section) Acute Rehab PT Goals Patient Stated Goal: eat something! PT Goal Formulation: No goals set, d/c therapy    Frequency     Barriers to discharge        Co-evaluation               End of Session Equipment Utilized During Treatment: Gait belt Activity Tolerance: Patient tolerated treatment well Patient left: in bed;with call bell/phone within reach;with bed alarm set Nurse Communication: Mobility status         Time: 1005-1020 PT Time Calculation (min): 15 min   Charges:   PT Evaluation $Initial PT Evaluation Tier I: 1 Procedure     PT G Codes:          Kennith Gain 08/23/2013, 10:37 AM

## 2013-08-24 LAB — CK: CK TOTAL: 112 U/L (ref 7–232)

## 2013-08-24 LAB — COMPREHENSIVE METABOLIC PANEL
ALT: 16 U/L (ref 0–53)
AST: 31 U/L (ref 0–37)
Albumin: 2.6 g/dL — ABNORMAL LOW (ref 3.5–5.2)
Alkaline Phosphatase: 79 U/L (ref 39–117)
BILIRUBIN TOTAL: 0.5 mg/dL (ref 0.3–1.2)
BUN: 19 mg/dL (ref 6–23)
CHLORIDE: 103 meq/L (ref 96–112)
CO2: 22 meq/L (ref 19–32)
CREATININE: 1.02 mg/dL (ref 0.50–1.35)
Calcium: 9.2 mg/dL (ref 8.4–10.5)
GFR, EST AFRICAN AMERICAN: 71 mL/min — AB (ref >90)
GFR, EST NON AFRICAN AMERICAN: 61 mL/min — AB (ref >90)
GLUCOSE: 87 mg/dL (ref 70–99)
Potassium: 3.9 mEq/L (ref 3.7–5.3)
Sodium: 135 mEq/L — ABNORMAL LOW (ref 137–147)
Total Protein: 5.7 g/dL — ABNORMAL LOW (ref 6.0–8.3)

## 2013-08-24 LAB — URINE CULTURE
Colony Count: NO GROWTH
Culture: NO GROWTH

## 2013-08-24 LAB — CBC
HEMATOCRIT: 29.6 % — AB (ref 39.0–52.0)
Hemoglobin: 9.9 g/dL — ABNORMAL LOW (ref 13.0–17.0)
MCH: 33 pg (ref 26.0–34.0)
MCHC: 33.4 g/dL (ref 30.0–36.0)
MCV: 98.7 fL (ref 78.0–100.0)
Platelets: 154 10*3/uL (ref 150–400)
RBC: 3 MIL/uL — ABNORMAL LOW (ref 4.22–5.81)
RDW: 15.3 % (ref 11.5–15.5)
WBC: 15 10*3/uL — AB (ref 4.0–10.5)

## 2013-08-24 MED ORDER — CEPHALEXIN 500 MG PO CAPS: 500.0000 mg | ORAL_CAPSULE | Freq: Two times a day (BID) | ORAL | Status: DC

## 2013-08-24 MED ORDER — CEPHALEXIN 500 MG PO CAPS
500.0000 mg | ORAL_CAPSULE | Freq: Two times a day (BID) | ORAL | Status: DC
  Administered 2013-08-24: 500 mg via ORAL
  Filled 2013-08-24 (×2): qty 1

## 2013-08-24 NOTE — Evaluation (Signed)
Occupational Therapy Evaluation Patient Details Name: [name] MRN: [id number] DOB: April 22, 1918 Today's Date: 08/24/2013    History of Present Illness Patient is a 80 year old male with past medical history significant for hypertension, hyperlipidemia, remote bladder cancer who had a recent hospitalization in January at [facility] in [place] for some sort of infection that is not clear to the patient's daughter at this time. After that hospitalization he was discharged to a rehabilitation facility and after 6 weeks was sent home where he has been doing well with home health therapies. Today when his home health nurse went to the house he was extremely weak to the point where he cannot get out of bed, had a temperature of 103.9 and 911 was called to bring him to the hospital.   Clinical Impression   Pt presents to OT with decreased I with ADL activity and will benefit from skilled OT to increase I with ADL activity abnd return to PLOF    Follow Up Recommendations  Home health OT    Equipment Recommendations  None recommended by OT       Precautions / Restrictions Precautions Precautions: Fall Restrictions Weight Bearing Restrictions: No      Mobility Bed Mobility Overal bed mobility: Needs Assistance Bed Mobility: Supine to Sit     Supine to sit: Min guard        Transfers Overall transfer level: Needs assistance Equipment used: 1 person hand held assist Transfers: Sit to/from Stand Sit to Stand: Min guard                   ADL Overall ADL's : Needs assistance/impaired         Upper Body Bathing: Set up;Sitting   Lower Body Bathing: Minimal assistance;Sit to/from stand   Upper Body Dressing : Minimal assistance;Sitting   Lower Body Dressing: Minimal assistance;Sit to/from stand   Toilet Transfer: Contractor- Water quality scientist and Hygiene: Minimal assistance;Sit to/from stand       Functional mobility  during ADLs: Education officer, community Communication Communication: HOH   Cognition Arousal/Alertness: Awake/alert Behavior During Therapy: WFL for tasks assessed/performed Overall Cognitive Status: Within Functional Limits for tasks assessed                     General Comments          Shoulder Instructions      Home Living Family/patient expects to be discharged to:: Private residence Living Arrangements: Children;Spouse/significant other Available Help at Discharge: Family Type of Home: House Home Access: Level entry     Home Layout: One [place]: Environmental consultant - 2 wheels          Prior Functioning/Environment Level of Independence: Independent with assistive device(s)             OT Diagnosis: Generalized weakness   OT Problem List: Decreased strength;Decreased activity tolerance   OT Treatment/Interventions: Self-care/ADL training;DME and/or AE instruction;Patient/family education    OT Goals(Current goals can be found in the care plan section) Acute Rehab OT Goals OT Goal Formulation: With patient Time For Goal Achievement: 09/07/13 Potential to Achieve Goals: Good  OT Frequency: Min 2X/week              End of Session Nurse Communication:  Mobility status  Activity Tolerance: Patient tolerated treatment well Patient left: in bed   Time: 3875-6433 OT Time Calculation (min): 46 min Charges:  OT General Charges $OT Visit: 1 Procedure OT Evaluation $Initial OT Evaluation Tier I: 1 Procedure OT Treatments $Self Care/Home Management : 23-37 mins G-Codes:    Betsy Pries 09/23/2013, 11:28 AM

## 2013-08-24 NOTE — Progress Notes (Signed)
08/24/2013 1430 NCM notified [facility] of pt admission. Active with [facility]. Jonnie Finner RN CCM Case Mgmt phone [phone]

## 2013-08-25 LAB — PATHOLOGIST SMEAR REVIEW

## 2013-08-26 NOTE — Discharge Summary (Signed)
Physician Discharge Summary  [name] ZHY:[id number] DOB: 1917/09/09 DOA: 08/22/2013  PCP: [name], MD  Admit date: 08/22/2013 Discharge date: 08/26/2013  Time spent: 35 minutes  Recommendations for Outpatient Follow-up:  1. Follow up with PCP in 1-2 weeks   Recommendations for primary care physician for things to follow:  Repeat CBC  Discharge Diagnoses:  Principal Problem:   FUO (fever of unknown origin) Active Problems:   Weakness   Leukocytosis  Discharge Condition: stable  Diet recommendation: heart healthy  Filed Weights   08/22/13 2236  Weight: 76.2 kg (167 lb 15.9 oz)   History of present illness:  Patient is a 80 year old male with past medical history significant for hypertension, hyperlipidemia, remote bladder cancer who had a recent hospitalization in January at [facility] in [place] for some sort of infection that is not clear to the patient's daughter at this time. After that hospitalization he was discharged to a rehabilitation facility and after 6 weeks was sent home where he has been doing well with home health therapies. Today when his home health nurse went to the house he was extremely weak to the point where he cannot get out of bed, had a temperature of 103.9 and 911 was called to bring him to the hospital. He has had a pretty extensive workup while in the emergency department including blood work that is only significant for marked leukocytosis, chest x-ray that is negative, urine analysis that is negative, normal lactic acid, normal LFTs. He was given a dose of vancomycin and Zosyn. Hospitalist admission was requested. Upon conversation with patient and daughter he does not appear confused and daughter attests to this. He denies nausea, vomiting, abdominal pain, diarrhea, cough, shortness of breath, chest pain, runny nose, headache, neck stiffness. His only complaint is dysuria and urinary frequency.  Hospital Course:  Fever - patient  reports this morning "soreness" all over in the first day, however that has resolved on its own. Influenza panel was checked and it was negative. Patient was afebrile for 48 hours throughout his hospitalization, and it's not clear what caused his fever in the first place. Patient has chronic venous stasis changes in bilateral lower extremities and on left possible cellulitis superimposed. He was discharged home on a 5 day course of Keflex, has an appointment with primary care provider in 4 days, and he will be reassessed at that time.  HTN - resume previous medications on discharge. HLD - continue home meds  History of bladder cancer - no hematuria on UA x 2  Leukocytosis - chronic, present as far as 2012. Monitor. Blood smear sent for pathologist review.   Procedures:  none   Consultations:  none  Discharge Exam: Filed Vitals:   08/23/13 0505 08/23/13 1430 08/23/13 1900 08/24/13 0513  BP: 107/51 127/55 112/47 109/49  Pulse: 49 65 62 51  Temp: 97.7 F (36.5 C) 97.6 F (36.4 C) 98.3 F (36.8 C) 98.4 F (36.9 C)  TempSrc: Oral Oral Oral Oral  Resp: 16 18 18 17   Height:      Weight:      SpO2: 99% 99% 99% 100%   General: NAD Cardiovascular: RRR Respiratory: CTA biL  Discharge Instructions    Medication List         amLODipine-benazepril 10-40 MG per capsule  Commonly known as:  LOTREL  Take 1 capsule by mouth daily.     brimonidine 0.15 % ophthalmic solution  Commonly known as:  ALPHAGAN  Place 1 drop into both  eyes 2 (two) times daily.     cephALEXin 500 MG capsule  Commonly known as:  KEFLEX  Take 1 capsule (500 mg total) by mouth every 12 (twelve) hours.     clopidogrel 75 MG tablet  Commonly known as:  PLAVIX  Take 75 mg by mouth daily with breakfast.     finasteride 5 MG tablet  Commonly known as:  PROSCAR  Take 5 mg by mouth daily.     furosemide 20 MG tablet  Commonly known as:  LASIX  Take 20 mg by mouth daily.     hydrALAZINE 10 MG tablet    Commonly known as:  APRESOLINE  Take 5 mg by mouth 2 (two) times daily. TAKE 1/2 TABLET     latanoprost 0.005 % ophthalmic solution  Commonly known as:  XALATAN  Place 1 drop into both eyes at bedtime.     niacin 500 MG CR tablet  Commonly known as:  NIASPAN  Take 1,000 mg by mouth at bedtime.     omega-3 acid ethyl esters 1 G capsule  Commonly known as:  LOVAZA  Take 1 g by mouth daily.     tamsulosin 0.4 MG Caps capsule  Commonly known as:  FLOMAX  Take 0.4 mg by mouth daily after supper.     traMADol 50 MG tablet  Commonly known as:  ULTRAM  Take 50 mg by mouth every 6 (six) hours as needed (PAIN).           Follow-up Information   Follow up with [name], MD. Schedule an appointment as soon as possible for a visit in 4 days.   Specialty:  Internal Medicine   Contact information:   [address] [place] [address] [phone]      The results of significant diagnostics from this hospitalization (including imaging, microbiology, ancillary and laboratory) are listed below for reference.    Significant Diagnostic Studies: Dg Chest 2 View  08/22/2013   CLINICAL DATA:  Shortness of breath, fever  EXAM: CHEST  2 VIEW  COMPARISON:  DG CHEST 2 VIEW dated 03/14/2011  FINDINGS: Heart size upper normal. The aorta is uncoiled. The vascular pattern is normal. There is mild vascular congestion. There is no consolidation or pleural effusion.  IMPRESSION: Stable mild diffuse interstitial change.   Electronically Signed   By: Skipper Cliche M.D.   On: 08/22/2013 16:24   US Renal  08/23/2013   CLINICAL DATA:  Flank pain, leukocytosis, history of hypertension and bladder cancer  EXAM: RENAL/URINARY TRACT ULTRASOUND COMPLETE  COMPARISON:  None.  FINDINGS: Right Kidney:  Normal cortical thickness, echogenicity and size, measuring 10.1 cm in length. No focal renal lesions. No echogenic renal stones. No urinary obstruction.  Left Kidney:  Normal cortical thickness, echogenicity  and size, measuring 10.4 cm in length. Note is made of an approximately 2.9 x 2.4 x 2.5 cm partially exophytic anechoic cyst arising from the inferior pole of the left kidney. No echogenic renal stones. No urinary obstruction.  Bladder:  Appears normal for degree of bladder distention.  IMPRESSION: 1. No explanation for patient's flank pain and leukocytosis. Specifically, no evidence of urinary obstruction. 2. Incidental note made of a partially exophytic approximately 2.9 cm left-sided renal cyst.   Electronically Signed   By: Sandi Mariscal M.D.   On: 08/23/2013 15:01    Microbiology: Recent Results (from the past 240 hour(s))  URINE CULTURE     Status: None   Collection Time    08/22/13  3:42 PM      Result Value Ref Range Status   Specimen Description URINE, CLEAN CATCH   Final   Special Requests NONE   Final   Culture  Setup Time     Final   Value: 08/22/2013 21:59     Performed at [facility]     Final   Value: 5,000 COLONIES/ML     Performed at [facility]   Culture     Final   Value: INSIGNIFICANT GROWTH     Performed at [facility]   Report Status 08/23/2013 FINAL   Final  CULTURE, BLOOD (ROUTINE X 2)     Status: None   Collection Time    08/22/13  3:57 PM      Result Value Ref Range Status   Specimen Description BLOOD LEFT ARM  5 ML IN [facility] BOTTLE   Final   Special Requests NONE   Final   Culture  Setup Time     Final   Value: 08/23/2013 01:04     Performed at [facility]   Culture     Final   Value:        BLOOD CULTURE RECEIVED NO GROWTH TO DATE CULTURE WILL BE HELD FOR 5 DAYS BEFORE ISSUING A FINAL NEGATIVE REPORT     Performed at [facility]   Report Status PENDING   Incomplete  CULTURE, BLOOD (ROUTINE X 2)     Status: None   Collection Time    08/22/13  3:57 PM      Result Value Ref Range Status   Specimen Description BLOOD RIGHT HAND  5 ML IN [facility] BOTTLE   Final   Special Requests NONE   Final   Culture   Setup Time     Final   Value: 08/23/2013 01:03     Performed at [facility]   Culture     Final   Value:        BLOOD CULTURE RECEIVED NO GROWTH TO DATE CULTURE WILL BE HELD FOR 5 DAYS BEFORE ISSUING A FINAL NEGATIVE REPORT     Performed at [facility]   Report Status PENDING   Incomplete  URINE CULTURE     Status: None   Collection Time    08/23/13  5:45 AM      Result Value Ref Range Status   Specimen Description URINE, RANDOM   Final   Special Requests NONE   Final   Culture  Setup Time     Final   Value: 08/23/2013 14:47     Performed at [facility]     Final   Value: NO GROWTH     Performed at [facility]   Culture     Final   Value: NO GROWTH     Performed at [facility]   Report Status 08/24/2013 FINAL   Final     Labs: Basic Metabolic Panel:  Recent Labs Lab 08/22/13 1557 08/23/13 0749 08/24/13 0435  NA 134* 136* 135*  K 4.2 4.5 3.9  CL 99 101 103  CO2 24 24 22   GLUCOSE 138* 91 87  BUN 23 21 19   CREATININE 1.29 1.14 1.02  CALCIUM 9.4 9.5 9.2   Liver Function Tests:  Recent Labs Lab 08/22/13 1557 08/24/13 0435  AST 24 31  ALT 14 16  ALKPHOS 97 79  BILITOT 0.6 0.5  PROT 6.4  5.7*  ALBUMIN 3.1* 2.6*   CBC:  Recent Labs Lab 08/22/13 1557 08/23/13 0749 08/24/13 0435  WBC 23.5* 20.1* 15.0*  HGB 11.0* 10.9* 9.9*  HCT 32.6* 32.5* 29.6*  MCV 97.3 98.8 98.7  PLT 191 160 154   Cardiac Enzymes:  Recent Labs Lab 08/24/13 0550  CKTOTAL 112    Signed:  Costin M Gherghe  [facility] 08/26/2013, 1:08 PM

## 2013-08-29 LAB — CULTURE, BLOOD (ROUTINE X 2)
CULTURE: NO GROWTH
Culture: NO GROWTH

## 2014-05-14 ENCOUNTER — Encounter (HOSPITAL_BASED_OUTPATIENT_CLINIC_OR_DEPARTMENT_OTHER): Payer: Medicare Other | Attending: Internal Medicine

## 2014-05-14 DIAGNOSIS — L97821 Non-pressure chronic ulcer of other part of left lower leg limited to breakdown of skin: Secondary | ICD-10-CM | POA: Diagnosis not present

## 2014-05-14 DIAGNOSIS — I87332 Chronic venous hypertension (idiopathic) with ulcer and inflammation of left lower extremity: Secondary | ICD-10-CM | POA: Diagnosis not present

## 2014-05-15 NOTE — Progress Notes (Signed)
[facility]  NAME:  [name], [name]           ACCOUNT NO.:  [id number]  MEDICAL RECORD NO.:  [id number]      DATE OF BIRTH:  1917-06-14  PHYSICIAN:  Ricard Dillon, M.D.      VISIT DATE:                                  OFFICE VISIT   CHIEF COMPLAINT:  Review of wound on the left anterior leg.  [name] is a 81 year old man who lives at home essentially on his own.  He has severe bilateral venous stasis chronically and has had a wound on the anterior leg for last 2-3 months.  It is not completely clear what he has been using on this, he says he puts a "salve" on this and a bandage daily, it is not improved and he has been referred by his primary physician, Dr. Mellody [name] for review of this.  PAST MEDICAL HISTORY:  Glaucoma, hypertension, PAD, PVD, bladder cancer in remission, and macular degeneration.  PAST SURGICAL HISTORY:  __________ infection in the right leg 2012, arterial bypass in the right leg in 2012, and right leg Achilles tendon repair.  CURRENT MEDICATIONS: 1. Lotrel 10/40 daily. 2. Alphagan ophthalmic. 3. Keflex 500 b.i.d. 4. Plavix 75 daily. 5. Proscar 5 daily. 6. Lasix 20 daily. 7. Apresoline 5 mg 2 times a day. 8. Xalatan ophthalmic. 9. Lovaza 1 g daily. 10.Flomax 0.4 daily. 11.Ultram 50 q.6 p.r.n.  PHYSICAL EXAMINATION:  VITAL SIGNS:  Temperature is 98, pulse 65, respirations 16, and blood pressure 148/44.  ABI is calculated in this clinic was 0.7.  The wound exam over the left anterior leg measuring 3.3 x 0.5 x 0.1.  This was hypergranulated tissue without any obvious infection.  The area was anesthetized.  The hypergranulation was removed with a #15 scalpel.  Bleeding controlled with silver nitrate and pressure.  He has a healed wound on the left medial ankle.  Peripheral pulses are difficult to feel.  IMPRESSION AND PLAN:  Largely, a venous insufficiency ulcer on the left anterior leg.  This is in a hypergranulated  state, this was removed with a scalpel.  We dressed this with silver alginate and a Kerlix Coban wrap.  He has known peripheral arterial disease with an ABI in this clinic of 0.7, I do not think he could tolerate more aggressive compression than that.  We will review him again in a week's time. Note he is on Keflex However I am not sure of the indication. No evidence of infection in the wound area          ______________________________ Ricard Dillon, M.D.     MGR/MEDQ  D:  05/14/2014  T:  05/15/2014  Job:  [id number]

## 2014-05-21 ENCOUNTER — Encounter (HOSPITAL_BASED_OUTPATIENT_CLINIC_OR_DEPARTMENT_OTHER): Payer: Medicare Other | Attending: Internal Medicine

## 2014-05-21 DIAGNOSIS — I87332 Chronic venous hypertension (idiopathic) with ulcer and inflammation of left lower extremity: Secondary | ICD-10-CM | POA: Diagnosis not present

## 2014-05-21 DIAGNOSIS — L97821 Non-pressure chronic ulcer of other part of left lower leg limited to breakdown of skin: Secondary | ICD-10-CM | POA: Insufficient documentation

## 2014-05-28 DIAGNOSIS — L97821 Non-pressure chronic ulcer of other part of left lower leg limited to breakdown of skin: Secondary | ICD-10-CM | POA: Diagnosis not present

## 2014-05-28 DIAGNOSIS — I87332 Chronic venous hypertension (idiopathic) with ulcer and inflammation of left lower extremity: Secondary | ICD-10-CM | POA: Diagnosis not present

## 2014-06-04 ENCOUNTER — Ambulatory Visit (HOSPITAL_COMMUNITY)
Admission: RE | Admit: 2014-06-04 | Discharge: 2014-06-04 | Disposition: A | Discharge status: Home / Self Care | Payer: Medicare Other | Source: Ambulatory Visit | Attending: Vascular Surgery | Admitting: Vascular Surgery

## 2014-06-04 ENCOUNTER — Other Ambulatory Visit: Payer: Self-pay | Admitting: Internal Medicine

## 2014-06-04 DIAGNOSIS — L97929 Non-pressure chronic ulcer of unspecified part of left lower leg with unspecified severity: Secondary | ICD-10-CM

## 2014-06-04 DIAGNOSIS — L97821 Non-pressure chronic ulcer of other part of left lower leg limited to breakdown of skin: Secondary | ICD-10-CM | POA: Diagnosis not present

## 2014-06-04 DIAGNOSIS — I87332 Chronic venous hypertension (idiopathic) with ulcer and inflammation of left lower extremity: Secondary | ICD-10-CM | POA: Diagnosis not present

## 2014-06-11 DIAGNOSIS — L97821 Non-pressure chronic ulcer of other part of left lower leg limited to breakdown of skin: Secondary | ICD-10-CM | POA: Diagnosis not present

## 2014-06-11 DIAGNOSIS — I87332 Chronic venous hypertension (idiopathic) with ulcer and inflammation of left lower extremity: Secondary | ICD-10-CM | POA: Diagnosis not present

## 2014-06-18 ENCOUNTER — Encounter (HOSPITAL_BASED_OUTPATIENT_CLINIC_OR_DEPARTMENT_OTHER): Payer: Medicare Other | Attending: Internal Medicine

## 2014-06-18 DIAGNOSIS — L97929 Non-pressure chronic ulcer of unspecified part of left lower leg with unspecified severity: Secondary | ICD-10-CM | POA: Insufficient documentation

## 2014-06-18 DIAGNOSIS — I739 Peripheral vascular disease, unspecified: Secondary | ICD-10-CM | POA: Insufficient documentation

## 2014-06-18 DIAGNOSIS — I87332 Chronic venous hypertension (idiopathic) with ulcer and inflammation of left lower extremity: Secondary | ICD-10-CM | POA: Insufficient documentation

## 2014-06-24 ENCOUNTER — Other Ambulatory Visit: Payer: Self-pay | Admitting: *Deleted

## 2014-06-24 DIAGNOSIS — I739 Peripheral vascular disease, unspecified: Secondary | ICD-10-CM

## 2014-06-24 DIAGNOSIS — L97929 Non-pressure chronic ulcer of unspecified part of left lower leg with unspecified severity: Secondary | ICD-10-CM

## 2014-06-25 DIAGNOSIS — L97929 Non-pressure chronic ulcer of unspecified part of left lower leg with unspecified severity: Secondary | ICD-10-CM | POA: Diagnosis not present

## 2014-06-25 DIAGNOSIS — I87332 Chronic venous hypertension (idiopathic) with ulcer and inflammation of left lower extremity: Secondary | ICD-10-CM | POA: Diagnosis not present

## 2014-06-25 DIAGNOSIS — I739 Peripheral vascular disease, unspecified: Secondary | ICD-10-CM | POA: Diagnosis not present

## 2014-06-30 ENCOUNTER — Encounter: Payer: Self-pay | Admitting: Vascular Surgery

## 2014-07-01 ENCOUNTER — Other Ambulatory Visit (HOSPITAL_COMMUNITY): Payer: Medicare Other

## 2014-07-01 ENCOUNTER — Encounter: Payer: Medicare Other | Admitting: Vascular Surgery

## 2014-07-02 DIAGNOSIS — L97929 Non-pressure chronic ulcer of unspecified part of left lower leg with unspecified severity: Secondary | ICD-10-CM | POA: Diagnosis not present

## 2014-07-02 DIAGNOSIS — I739 Peripheral vascular disease, unspecified: Secondary | ICD-10-CM | POA: Diagnosis not present

## 2014-07-02 DIAGNOSIS — I87332 Chronic venous hypertension (idiopathic) with ulcer and inflammation of left lower extremity: Secondary | ICD-10-CM | POA: Diagnosis not present

## 2014-07-16 ENCOUNTER — Encounter (HOSPITAL_BASED_OUTPATIENT_CLINIC_OR_DEPARTMENT_OTHER): Payer: Medicare Other | Attending: Internal Medicine

## 2014-07-16 DIAGNOSIS — L97821 Non-pressure chronic ulcer of other part of left lower leg limited to breakdown of skin: Secondary | ICD-10-CM | POA: Insufficient documentation

## 2014-07-16 DIAGNOSIS — I87332 Chronic venous hypertension (idiopathic) with ulcer and inflammation of left lower extremity: Secondary | ICD-10-CM | POA: Insufficient documentation

## 2014-07-23 DIAGNOSIS — I87332 Chronic venous hypertension (idiopathic) with ulcer and inflammation of left lower extremity: Secondary | ICD-10-CM | POA: Diagnosis not present

## 2014-07-23 DIAGNOSIS — L97821 Non-pressure chronic ulcer of other part of left lower leg limited to breakdown of skin: Secondary | ICD-10-CM | POA: Diagnosis not present

## 2014-07-30 DIAGNOSIS — I87332 Chronic venous hypertension (idiopathic) with ulcer and inflammation of left lower extremity: Secondary | ICD-10-CM | POA: Diagnosis not present

## 2014-07-30 DIAGNOSIS — L97821 Non-pressure chronic ulcer of other part of left lower leg limited to breakdown of skin: Secondary | ICD-10-CM | POA: Diagnosis not present

## 2014-08-06 DIAGNOSIS — L97821 Non-pressure chronic ulcer of other part of left lower leg limited to breakdown of skin: Secondary | ICD-10-CM | POA: Diagnosis not present

## 2014-08-06 DIAGNOSIS — I87332 Chronic venous hypertension (idiopathic) with ulcer and inflammation of left lower extremity: Secondary | ICD-10-CM | POA: Diagnosis not present

## 2014-08-13 DIAGNOSIS — L97821 Non-pressure chronic ulcer of other part of left lower leg limited to breakdown of skin: Secondary | ICD-10-CM | POA: Diagnosis not present

## 2014-08-13 DIAGNOSIS — I87332 Chronic venous hypertension (idiopathic) with ulcer and inflammation of left lower extremity: Secondary | ICD-10-CM | POA: Diagnosis not present

## 2014-08-20 ENCOUNTER — Encounter (HOSPITAL_BASED_OUTPATIENT_CLINIC_OR_DEPARTMENT_OTHER): Payer: Medicare Other | Attending: Internal Medicine

## 2014-08-20 DIAGNOSIS — I87332 Chronic venous hypertension (idiopathic) with ulcer and inflammation of left lower extremity: Secondary | ICD-10-CM | POA: Insufficient documentation

## 2014-08-20 DIAGNOSIS — L97821 Non-pressure chronic ulcer of other part of left lower leg limited to breakdown of skin: Secondary | ICD-10-CM | POA: Insufficient documentation

## 2014-08-27 DIAGNOSIS — L97821 Non-pressure chronic ulcer of other part of left lower leg limited to breakdown of skin: Secondary | ICD-10-CM | POA: Diagnosis not present

## 2014-08-27 DIAGNOSIS — I87332 Chronic venous hypertension (idiopathic) with ulcer and inflammation of left lower extremity: Secondary | ICD-10-CM | POA: Diagnosis not present

## 2014-09-03 DIAGNOSIS — I87332 Chronic venous hypertension (idiopathic) with ulcer and inflammation of left lower extremity: Secondary | ICD-10-CM | POA: Diagnosis not present

## 2014-09-03 DIAGNOSIS — L97821 Non-pressure chronic ulcer of other part of left lower leg limited to breakdown of skin: Secondary | ICD-10-CM | POA: Diagnosis not present

## 2014-09-10 DIAGNOSIS — L97821 Non-pressure chronic ulcer of other part of left lower leg limited to breakdown of skin: Secondary | ICD-10-CM | POA: Diagnosis not present

## 2014-09-10 DIAGNOSIS — I87332 Chronic venous hypertension (idiopathic) with ulcer and inflammation of left lower extremity: Secondary | ICD-10-CM | POA: Diagnosis not present

## 2014-09-17 ENCOUNTER — Encounter (HOSPITAL_BASED_OUTPATIENT_CLINIC_OR_DEPARTMENT_OTHER): Payer: Medicare Other | Attending: Internal Medicine

## 2014-09-17 DIAGNOSIS — L97821 Non-pressure chronic ulcer of other part of left lower leg limited to breakdown of skin: Secondary | ICD-10-CM | POA: Insufficient documentation

## 2014-09-17 DIAGNOSIS — I87332 Chronic venous hypertension (idiopathic) with ulcer and inflammation of left lower extremity: Secondary | ICD-10-CM | POA: Diagnosis present

## 2014-09-24 DIAGNOSIS — I87332 Chronic venous hypertension (idiopathic) with ulcer and inflammation of left lower extremity: Secondary | ICD-10-CM | POA: Diagnosis not present

## 2014-09-24 DIAGNOSIS — L97821 Non-pressure chronic ulcer of other part of left lower leg limited to breakdown of skin: Secondary | ICD-10-CM | POA: Diagnosis not present

## 2014-10-01 DIAGNOSIS — L97821 Non-pressure chronic ulcer of other part of left lower leg limited to breakdown of skin: Secondary | ICD-10-CM | POA: Diagnosis not present

## 2014-10-01 DIAGNOSIS — I87332 Chronic venous hypertension (idiopathic) with ulcer and inflammation of left lower extremity: Secondary | ICD-10-CM | POA: Diagnosis not present

## 2014-10-08 DIAGNOSIS — I87332 Chronic venous hypertension (idiopathic) with ulcer and inflammation of left lower extremity: Secondary | ICD-10-CM | POA: Diagnosis not present

## 2014-10-08 DIAGNOSIS — L97821 Non-pressure chronic ulcer of other part of left lower leg limited to breakdown of skin: Secondary | ICD-10-CM | POA: Diagnosis not present

## 2014-10-15 ENCOUNTER — Encounter (HOSPITAL_BASED_OUTPATIENT_CLINIC_OR_DEPARTMENT_OTHER): Payer: Medicare Other | Attending: Internal Medicine

## 2014-10-15 DIAGNOSIS — I83222 Varicose veins of left lower extremity with both ulcer of calf and inflammation: Secondary | ICD-10-CM | POA: Insufficient documentation

## 2014-10-15 DIAGNOSIS — L97221 Non-pressure chronic ulcer of left calf limited to breakdown of skin: Secondary | ICD-10-CM | POA: Diagnosis not present

## 2014-10-22 DIAGNOSIS — L97221 Non-pressure chronic ulcer of left calf limited to breakdown of skin: Secondary | ICD-10-CM | POA: Diagnosis not present

## 2014-10-22 DIAGNOSIS — I83222 Varicose veins of left lower extremity with both ulcer of calf and inflammation: Secondary | ICD-10-CM | POA: Diagnosis not present

## 2014-10-29 DIAGNOSIS — L97221 Non-pressure chronic ulcer of left calf limited to breakdown of skin: Secondary | ICD-10-CM | POA: Diagnosis not present

## 2014-10-29 DIAGNOSIS — I83222 Varicose veins of left lower extremity with both ulcer of calf and inflammation: Secondary | ICD-10-CM | POA: Diagnosis not present

## 2014-11-05 DIAGNOSIS — I83222 Varicose veins of left lower extremity with both ulcer of calf and inflammation: Secondary | ICD-10-CM | POA: Diagnosis not present

## 2014-11-05 DIAGNOSIS — L97221 Non-pressure chronic ulcer of left calf limited to breakdown of skin: Secondary | ICD-10-CM | POA: Diagnosis not present

## 2014-11-12 DIAGNOSIS — I83222 Varicose veins of left lower extremity with both ulcer of calf and inflammation: Secondary | ICD-10-CM | POA: Diagnosis not present

## 2014-11-12 DIAGNOSIS — L97221 Non-pressure chronic ulcer of left calf limited to breakdown of skin: Secondary | ICD-10-CM | POA: Diagnosis not present

## 2014-11-19 ENCOUNTER — Other Ambulatory Visit: Payer: Self-pay | Admitting: Internal Medicine

## 2014-11-19 ENCOUNTER — Encounter (HOSPITAL_BASED_OUTPATIENT_CLINIC_OR_DEPARTMENT_OTHER): Payer: Medicare Other | Attending: Internal Medicine

## 2014-11-19 DIAGNOSIS — I83222 Varicose veins of left lower extremity with both ulcer of calf and inflammation: Secondary | ICD-10-CM | POA: Insufficient documentation

## 2014-11-26 DIAGNOSIS — I83222 Varicose veins of left lower extremity with both ulcer of calf and inflammation: Secondary | ICD-10-CM | POA: Diagnosis not present

## 2014-12-03 DIAGNOSIS — I83222 Varicose veins of left lower extremity with both ulcer of calf and inflammation: Secondary | ICD-10-CM | POA: Diagnosis not present

## 2014-12-10 DIAGNOSIS — I83222 Varicose veins of left lower extremity with both ulcer of calf and inflammation: Secondary | ICD-10-CM | POA: Diagnosis not present

## 2014-12-17 ENCOUNTER — Encounter (HOSPITAL_BASED_OUTPATIENT_CLINIC_OR_DEPARTMENT_OTHER): Payer: Medicare Other | Attending: Internal Medicine

## 2014-12-17 DIAGNOSIS — L97211 Non-pressure chronic ulcer of right calf limited to breakdown of skin: Secondary | ICD-10-CM | POA: Insufficient documentation

## 2014-12-17 DIAGNOSIS — I83222 Varicose veins of left lower extremity with both ulcer of calf and inflammation: Secondary | ICD-10-CM | POA: Insufficient documentation

## 2014-12-17 DIAGNOSIS — L97221 Non-pressure chronic ulcer of left calf limited to breakdown of skin: Secondary | ICD-10-CM | POA: Insufficient documentation

## 2014-12-28 DIAGNOSIS — L97221 Non-pressure chronic ulcer of left calf limited to breakdown of skin: Secondary | ICD-10-CM | POA: Diagnosis not present

## 2014-12-28 DIAGNOSIS — L97211 Non-pressure chronic ulcer of right calf limited to breakdown of skin: Secondary | ICD-10-CM | POA: Diagnosis not present

## 2014-12-28 DIAGNOSIS — I83222 Varicose veins of left lower extremity with both ulcer of calf and inflammation: Secondary | ICD-10-CM | POA: Diagnosis not present

## 2015-01-19 NOTE — Progress Notes (Signed)
Histology and Location of Primary Skin Cancer:Basal Cell of the Left Lower Leg  11/19/14 Diagnosis Skin , left lower leg, punch biopsy BASAL CELL CARCINOMA, NODULAR PATTERN  Diagnosis Skin , right lower leg, punch biopsy ULCERATED BASAL CELL CARCINOMA, NODULAR PATTERN  [facility] D[facility] presented with the following signs/symptoms: Hx of wounds  In th lower extremities due to poor circulation which have required treatment.  New diagnosis of ulcerated Basal Cell.  Lower extremities with firm edema with marked redness.  Past/Anticipated interventions by patient's surgeon/dermatologist for current problematic lesion, if any:  Received treatment in the wound care center zx 3 weeks without anty results, then determined that he had Basal Cell carcinoma.  Dr. Tobias Alexander recommends surgery: Family wanted 2nd opinion:  Referral to Radiation  Past skin cancers, if any:  1) Location/Histology/Intervention: Face in past: surgical removal  2) Location/Histology/Intervention:   3) Location/Histology/Intervention:  History of Blistering sunburns, if any:  SAFETY ISSUES:  Prior radiation? No  Pacemaker/ICD? No  Possible current pregnancy? N/A  Is the patient on methotrexate? No  Current Complaints / other details:   History of Bladder Cancer

## 2015-01-21 ENCOUNTER — Ambulatory Visit
Admission: RE | Admit: 2015-01-21 | Discharge: 2015-01-21 | Disposition: A | Discharge status: Home / Self Care | Payer: Medicare Other | Source: Ambulatory Visit | Attending: Radiation Oncology | Admitting: Radiation Oncology

## 2015-01-21 ENCOUNTER — Encounter: Payer: Self-pay | Admitting: Radiation Oncology

## 2015-01-21 VITALS — BP 130/58 | HR 60 | Temp 98.5°F | Ht 70.0 in | Wt 169.4 lb

## 2015-01-21 DIAGNOSIS — C44712 Basal cell carcinoma of skin of right lower limb, including hip: Secondary | ICD-10-CM | POA: Insufficient documentation

## 2015-01-21 DIAGNOSIS — Z510 Encounter for antineoplastic radiation therapy: Secondary | ICD-10-CM | POA: Diagnosis present

## 2015-01-21 DIAGNOSIS — C44719 Basal cell carcinoma of skin of left lower limb, including hip: Secondary | ICD-10-CM | POA: Diagnosis present

## 2015-01-21 DIAGNOSIS — C44711 Basal cell carcinoma of skin of unspecified lower limb, including hip: Secondary | ICD-10-CM | POA: Insufficient documentation

## 2015-01-21 DIAGNOSIS — F17290 Nicotine dependence, other tobacco product, uncomplicated: Secondary | ICD-10-CM | POA: Insufficient documentation

## 2015-01-21 NOTE — Addendum Note (Signed)
Encounter addended by: Benn Moulder, RN on: 01/21/2015  3:11 PM<BR>     Documentation filed: Charges VN

## 2015-01-21 NOTE — Addendum Note (Signed)
Encounter addended by: Arloa Koh, MD on: 01/21/2015  1:27 PM<BR>     Documentation filed: Notes Section

## 2015-01-21 NOTE — Progress Notes (Addendum)
[facility] NEW PATIENT EVALUATION  Name: [name] MRN: [id number]  Date:   01/21/2015           DOB: Oct 11, 1917  Status: outpatient   CC: [name] Panda, MD     REFERRING PHYSICIAN: [name], MD   DIAGNOSIS:  Basal cell carcinomas of the skine,  left and right pretibial regions  HISTORY OF PRESENT ILLNESS:  [name] is a 81 y.o. male who is seen today through the courtesy of Dr. Mellody Drown for consideration of radiation therapy in the management of his bilateral basal cell carcinomas of the pretibial regions.  According to his daughter, [name], he is been visited the wound center for approximately 30 weeks for management of an ulcer along his left pretibial region.  I understand that he had biopsy of a left tibial nodular ulcerated lesion and also a smaller lesion along the right pretibial region which were diagnostic for basal cell carcinoma.  He is had drainage primarily from his left nodular ulcerated lesion but he does have weeping of  fluid from both lower extremities secondary to edema.  I understand that he was seen by Dr. Migdalia Dk who did feel that he could have excision (and grafting), but the patient and his family decline surgery.  He is seen today for consideration of radiation therapy.    PREVIOUS RADIATION THERAPY: No   PAST MEDICAL HISTORY:  has a past medical history of Arthritis; Cancer; and Hypertension.     PAST SURGICAL HISTORY: No past surgical history on file.   FAMILY HISTORY: family history is not on file.   SOCIAL HISTORY:  reports that he has been smoking Pipe.  He has never used smokeless tobacco. He reports that he drinks about 1.2 oz of alcohol per week. He reports that he does not use illicit drugs.  Remarried, 3 daughters from his first marriage.  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   ALLERGIES: Review of patient's allergies indicates no known allergies.   MEDICATIONS:  Current Outpatient Prescriptions  Medication Sig Dispense Refill  . amLODipine-benazepril (LOTREL) 10-40 MG per capsule Take 1 capsule by mouth daily.    . brimonidine (ALPHAGAN) 0.15 % ophthalmic solution Place 1 drop into both eyes 2 (two) times daily.    . brinzolamide (AZOPT) 1 % ophthalmic suspension Place 1 drop into both eyes 3 (three) times daily.    . clopidogrel (PLAVIX) 75 MG tablet Take 75 mg by mouth daily with breakfast.    . docusate sodium (COLACE) 100 MG capsule Take 100 mg by mouth 2 (two) times daily.    . finasteride (PROSCAR) 5 MG tablet Take 5 mg by mouth daily.    . furosemide (LASIX) 20 MG tablet Take 20 mg by mouth daily.    . hydrALAZINE (APRESOLINE) 10 MG tablet Take 5 mg by mouth 2 (two) times daily. TAKE 1/2 TABLET    . latanoprost (XALATAN) 0.005 % ophthalmic solution Place 1 drop into both eyes at bedtime.    Marland Kitchen MEGARED OMEGA-3 KRILL OIL 500 MG CAPS Take 1 tablet by mouth daily.    . tamsulosin (FLOMAX) 0.4 MG CAPS capsule Take 0.4 mg by mouth daily after supper.    . traMADol (ULTRAM) 50 MG tablet Take 50 mg by mouth every 6 (six) hours as needed (PAIN).    Marland Kitchen  niacin (NIASPAN) 500 MG CR tablet Take 1,000 mg by mouth at bedtime.     No current facility-administered medications for this encounter.     REVIEW OF SYSTEMS:  Pertinent items are noted in HPI.    PHYSICAL EXAM:  height is 5\' 10"  (1.778 m) and weight is 169 lb 6.4 oz (76.839 kg). His temperature is 98.5 F (36.9 C). His blood pressure is 130/58 and his pulse is 60.   Alert and oriented, but hard of hearing 81 year old white male appearing slightly younger than his stated age.  On inspection of his left lower extremity pretibial region there is a 4 x 3 cm nodular ulcer with surrounding erythema, keratosis, and weeping fluid from his edema.  On inspection of his right pretibial region there is a slight  depression just the right of midline but there is no ulceration or nodularity seen.  There is also weeping of fluid from his edema along with erythema the skin and patchy keratosis.     L pretibial region       R Pretibial region (no obvious cancer)    LABORATORY DATA:  Lab Results  Component Value Date   WBC 15.0* 08/24/2013   HGB 9.9* 08/24/2013   HCT 29.6* 08/24/2013   MCV 98.7 08/24/2013   PLT 154 08/24/2013   Lab Results  Component Value Date   NA 135* 08/24/2013   K 3.9 08/24/2013   CL 103 08/24/2013   CO2 22 08/24/2013   Lab Results  Component Value Date   ALT 16 08/24/2013   AST 31 08/24/2013   ALKPHOS 79 08/24/2013   BILITOT 0.5 08/24/2013      IMPRESSION: Biopsy-proven bilateral basal cell carcinomas of the skin, primarily involving the left pretibial region.  I have difficulty defining the exact location and extent of his right pretibial basal cell carcinoma.  The left pretibial carcinoma is easily identified.  We discussed management options which include surgery versus radiation therapy.  Surgery may be more successful than radiation therapy although I think the likelihood for cure in this setting is probably 90%.  After lengthy discussion, the patient and his family decline surgery but would like to proceed with radiation therapy for his left pretibial lesion and follow the right pretibial area at this time since there is no obvious ulceration.  Ordinarily, we would recommend a fractionated course of over 5-6 weeks, but I think we can give him hypofractionated treatment which would involve 2 treatments a week for 5 weeks (10 treatments altogether) with similar success.  This would limit the number of visits and cost of treatment.  I explained to the patient and his family that it may take many months for him to have complete regression of tumor and reepithelialization.  He may end up with a chronic nonhealing ulcer based on location and his age.  Consent is signed  today.   PLAN: The patient and family request that he return for treatment planning/simulation the week of September 19.  I'll begin his radiation therapy the week of October 3.    spent 30 minutes face to face with the patient and more than 50% of that time was spent in counseling and/or coordination of care.

## 2015-02-01 ENCOUNTER — Ambulatory Visit
Admission: RE | Admit: 2015-02-01 | Discharge: 2015-02-01 | Disposition: A | Discharge status: Home / Self Care | Payer: Medicare Other | Source: Ambulatory Visit | Attending: Radiation Oncology | Admitting: Radiation Oncology

## 2015-02-01 ENCOUNTER — Ambulatory Visit: Payer: Medicare Other | Admitting: Radiation Oncology

## 2015-02-01 DIAGNOSIS — Z510 Encounter for antineoplastic radiation therapy: Secondary | ICD-10-CM | POA: Diagnosis not present

## 2015-02-01 DIAGNOSIS — C44719 Basal cell carcinoma of skin of left lower limb, including hip: Secondary | ICD-10-CM

## 2015-02-01 NOTE — Progress Notes (Signed)
Complex simulation/treatment planning note: The patient was taken to the CT simulator. An alpha cradle was constructed for immobilization. I marked his treatment border for construction of a complex block a.  He was then scanned in an attempt to determine the depth of his carcinoma for prescribing his electron beam energy.  I am prescribing 4400 cGy in 10 sessions with 2 treatments a week.  He'll begin his radiation therapy on Monday, October 3.

## 2015-02-02 ENCOUNTER — Ambulatory Visit: Payer: Medicare Other | Admitting: Radiation Oncology

## 2015-02-03 ENCOUNTER — Encounter: Payer: Self-pay | Admitting: Radiation Oncology

## 2015-02-03 DIAGNOSIS — Z510 Encounter for antineoplastic radiation therapy: Secondary | ICD-10-CM | POA: Diagnosis not present

## 2015-02-03 NOTE — Progress Notes (Signed)
Electron beam simulation/treatment planning note: The patient completed electron beam treatment planning today for treatment to his left anterior  tibial region.  We looked at both 6 MEV, and 9 MeV electrons with 0.8 cm bolus.  It was difficult to determine the exact depth of his carcinoma so I chose 9 MeV electrons to provide deeper coverage.  One custom block is constructed.  An isodose plan is reviewed with a Monte Carlo calculation.  I'm prescribing 4400 cGy in 10 sessions treating him twice a week.  0.8 cm custom bolus will be constructed on the first day of his treatment.

## 2015-02-04 ENCOUNTER — Ambulatory Visit: Payer: Medicare Other

## 2015-02-08 ENCOUNTER — Ambulatory Visit: Payer: Medicare Other

## 2015-02-11 ENCOUNTER — Ambulatory Visit: Payer: Medicare Other

## 2015-02-15 ENCOUNTER — Ambulatory Visit
Admission: RE | Admit: 2015-02-15 | Discharge: 2015-02-15 | Disposition: A | Discharge status: Home / Self Care | Payer: Medicare Other | Source: Ambulatory Visit | Attending: Radiation Oncology | Admitting: Radiation Oncology

## 2015-02-15 ENCOUNTER — Encounter: Payer: Self-pay | Admitting: Radiation Oncology

## 2015-02-15 ENCOUNTER — Ambulatory Visit: Payer: Medicare Other

## 2015-02-15 VITALS — BP 155/71 | HR 58 | Temp 97.7°F | Ht 70.0 in

## 2015-02-15 DIAGNOSIS — C44712 Basal cell carcinoma of skin of right lower limb, including hip: Secondary | ICD-10-CM | POA: Diagnosis present

## 2015-02-15 DIAGNOSIS — F17290 Nicotine dependence, other tobacco product, uncomplicated: Secondary | ICD-10-CM | POA: Diagnosis not present

## 2015-02-15 DIAGNOSIS — Z510 Encounter for antineoplastic radiation therapy: Secondary | ICD-10-CM | POA: Diagnosis present

## 2015-02-15 DIAGNOSIS — C44719 Basal cell carcinoma of skin of left lower limb, including hip: Secondary | ICD-10-CM | POA: Diagnosis present

## 2015-02-15 NOTE — Progress Notes (Signed)
Mr. Heiberger has received his 1st fraction to his left leg.   Ulcer left leg with mild drainage.  Dressing applied with 4X4 gauge and adhered with Hypafix tape.

## 2015-02-15 NOTE — Progress Notes (Signed)
Weekly Management Note:  Site: Left pretibial region Current Dose:  440  cGy Projected Dose: 4400  cGy  Narrative: The patient is seen today for routine under treatment assessment. CBCT/MVCT images/port films were reviewed. The chart was reviewed.   The patient begins his radiation therapy today.  His previous in the ulcer is essentially unchanged.  Physical Examination:  Filed Vitals:   02/15/15 1725  BP: 155/71  Pulse: 58  Temp: 97.7 F (36.5 C)  .  Weight:  .  No change.  Impression: Tolerating radiation therapy well.  Plan: Continue radiation therapy as planned.

## 2015-02-16 ENCOUNTER — Ambulatory Visit: Payer: Medicare Other | Admitting: Radiation Oncology

## 2015-02-18 ENCOUNTER — Ambulatory Visit
Admission: RE | Admit: 2015-02-18 | Discharge: 2015-02-18 | Disposition: A | Discharge status: Home / Self Care | Payer: Medicare Other | Source: Ambulatory Visit | Attending: Radiation Oncology | Admitting: Radiation Oncology

## 2015-02-18 DIAGNOSIS — Z510 Encounter for antineoplastic radiation therapy: Secondary | ICD-10-CM | POA: Diagnosis not present

## 2015-02-18 NOTE — Progress Notes (Signed)
Dressing applied to ulcerated area on both anterior lower legs following XRT today. His actual field is left anterior calf with yellow drainage and was cleansed with sterile saline and redressed with 4 x 4  and adhered with hypafix tape.

## 2015-02-22 ENCOUNTER — Ambulatory Visit
Admission: RE | Admit: 2015-02-22 | Discharge: 2015-02-22 | Disposition: A | Discharge status: Home / Self Care | Payer: Medicare Other | Source: Ambulatory Visit | Attending: Radiation Oncology | Admitting: Radiation Oncology

## 2015-02-22 ENCOUNTER — Ambulatory Visit: Payer: Medicare Other | Attending: Radiation Oncology | Admitting: Radiation Oncology

## 2015-02-22 VITALS — BP 148/70 | HR 60 | Temp 98.3°F

## 2015-02-22 DIAGNOSIS — Z510 Encounter for antineoplastic radiation therapy: Secondary | ICD-10-CM | POA: Diagnosis not present

## 2015-02-22 DIAGNOSIS — C44719 Basal cell carcinoma of skin of left lower limb, including hip: Secondary | ICD-10-CM

## 2015-02-22 NOTE — Addendum Note (Signed)
Encounter addended by: Benn Moulder, RN on: 02/22/2015  5:05 PM<BR>     Documentation filed: Notes Section, Vitals Section

## 2015-02-22 NOTE — Progress Notes (Addendum)
[name] seen by Dr. Valere Dross in the treatment area today. He denies any pain today.  The ulcerated area on his left, lower leg has decreased drainage today, and note a red hue to treated area.  Area redressed with drain sponges x 2 to absorb moisture and adhered with Hypafix tape.  Dressing reapplied with previously stated dressing type, to the right leg, which has weeping due to swelling and was moist to touch .  Advised [name] and his daughter, per Dr. Valere Dross, not to apply neosporin to the treated area and she stated she would notify his caregivers.

## 2015-02-22 NOTE — Progress Notes (Signed)
Weekly Management Note:  Site: left pretibial region Current Dose:   1320  cGy Projected Dose:  4400  cGy  Narrative: The patient is seen today for routine under treatment assessment. CBCT/MVCT images/port films were reviewed. The chart was reviewed.    He is without complaints today.  Physical Examination: There were no vitals filed for this visit..  Weight:  .  There has been no significant change in his lesion although it may be somewhat drier.  Impression: Tolerating radiation therapy well.  Plan: Continue radiation therapy as planned.

## 2015-02-25 ENCOUNTER — Ambulatory Visit
Admission: RE | Admit: 2015-02-25 | Discharge: 2015-02-25 | Disposition: A | Discharge status: Home / Self Care | Payer: Medicare Other | Source: Ambulatory Visit | Attending: Radiation Oncology | Admitting: Radiation Oncology

## 2015-02-25 DIAGNOSIS — Z510 Encounter for antineoplastic radiation therapy: Secondary | ICD-10-CM | POA: Diagnosis not present

## 2015-02-26 ENCOUNTER — Ambulatory Visit: Payer: Medicare Other

## 2015-03-01 ENCOUNTER — Ambulatory Visit
Admission: RE | Admit: 2015-03-01 | Discharge: 2015-03-01 | Disposition: A | Discharge status: Home / Self Care | Payer: Medicare Other | Source: Ambulatory Visit | Attending: Radiation Oncology | Admitting: Radiation Oncology

## 2015-03-01 ENCOUNTER — Encounter: Payer: Self-pay | Admitting: Radiation Oncology

## 2015-03-01 VITALS — BP 134/66 | HR 60 | Temp 97.8°F | Resp 20 | Ht 70.0 in

## 2015-03-01 DIAGNOSIS — C44719 Basal cell carcinoma of skin of left lower limb, including hip: Secondary | ICD-10-CM

## 2015-03-01 DIAGNOSIS — Z510 Encounter for antineoplastic radiation therapy: Secondary | ICD-10-CM | POA: Diagnosis not present

## 2015-03-01 NOTE — Progress Notes (Addendum)
[name] has completed 5 fractions to his left lower leg.  He is here with his daughter today.  He denies pain.  The area on his left lower leg is red. He reports it is not draining as much as it was.  He reports his right leg is draining more and he had been using neosporin on the right side.  He reports fatigue.  They have been given the Radiation Therapy and You book to review.  BP 134/66 mmHg  Pulse 60  Temp(Src) 97.8 F (36.6 C) (Oral)  Resp 20  Ht 5\' 10"  (1.778 m)  Wt    .

## 2015-03-01 NOTE — Progress Notes (Signed)
Cleaned left and right lower leg wounds with saline.  Covered wounds with drain sponges and secured with medipore tape.

## 2015-03-01 NOTE — Progress Notes (Signed)
Weekly Management Note:  Site: Left anterior tibia Current Dose:  2200  cGy Projected Dose: 4400  cGy  Narrative: The patient is seen today for routine under treatment assessment. CBCT/MVCT images/port films were reviewed. The chart was reviewed.   He is without new complaints today.  His family thinks that the lesion is slightly drier although it does continue to drain clear fluid.  Physical Examination:  Filed Vitals:   03/01/15 1152  BP: 134/66  Pulse: 60  Temp: 97.8 F (36.6 C)  Resp: 20  .  Weight:  .  The carcinomas well demarcated with no significant surrounding erythema or areas of desquamation.  Impression: Tolerating radiation therapy well.  Plan: Continue radiation therapy as planned.

## 2015-03-04 ENCOUNTER — Ambulatory Visit: Payer: Medicare Other

## 2015-03-05 ENCOUNTER — Ambulatory Visit: Payer: Medicare Other

## 2015-03-05 ENCOUNTER — Ambulatory Visit
Admission: RE | Admit: 2015-03-05 | Discharge: 2015-03-05 | Disposition: A | Discharge status: Home / Self Care | Payer: Medicare Other | Source: Ambulatory Visit | Attending: Radiation Oncology | Admitting: Radiation Oncology

## 2015-03-05 DIAGNOSIS — Z510 Encounter for antineoplastic radiation therapy: Secondary | ICD-10-CM | POA: Diagnosis not present

## 2015-03-08 ENCOUNTER — Ambulatory Visit
Admission: RE | Admit: 2015-03-08 | Discharge: 2015-03-08 | Disposition: A | Discharge status: Home / Self Care | Payer: Medicare Other | Source: Ambulatory Visit | Attending: Radiation Oncology | Admitting: Radiation Oncology

## 2015-03-08 ENCOUNTER — Encounter: Payer: Self-pay | Admitting: Radiation Oncology

## 2015-03-08 VITALS — BP 143/98 | HR 62 | Temp 98.1°F | Resp 12 | Ht 70.0 in

## 2015-03-08 DIAGNOSIS — C44719 Basal cell carcinoma of skin of left lower limb, including hip: Secondary | ICD-10-CM

## 2015-03-08 DIAGNOSIS — Z510 Encounter for antineoplastic radiation therapy: Secondary | ICD-10-CM | POA: Diagnosis not present

## 2015-03-08 NOTE — Progress Notes (Signed)
Weekly Management Note:  Site: Left lower leg Current Dose:  3080  cGy Projected Dose: 4400  cGy  Narrative: The patient is seen today for routine under treatment assessment. CBCT/MVCT images/port films were reviewed. The chart was reviewed.   He is without complaints today.  Physical Examination:  Filed Vitals:   03/08/15 1550  BP: 143/98  Pulse: 62  Temp: 98.1 F (36.7 C)  Resp: 12  .  Weight:  .  On inspection of the left pretibial region his ulcerative lesion is somewhat flatter and there is less drainage.  Impression: Tolerating radiation therapy well.  He will finish his radiation therapy next week.  Plan: Continue radiation therapy as planned.

## 2015-03-08 NOTE — Progress Notes (Signed)
[name] has completed 7 fractions to his left lower leg.  He reports pain today in both legs at a 5/10.  He said it is not any worse than when he started radiation.  He reports he is walking at home.  The would on his left leg is red.  He reports it has clear drainage.  His right leg is draining yellow drainage.  BP 143/98 mmHg  Pulse 62  Temp(Src) 98.1 F (36.7 C) (Oral)  Resp 12  Ht 5\' 10"  (1.778 m)  Wt

## 2015-03-08 NOTE — Progress Notes (Signed)
Wounds on both of patient's lower legs were cleansed with normal saline.  Drain sponges applied to wounds and secured with medipore tape.

## 2015-03-08 NOTE — Addendum Note (Signed)
Encounter addended by: Jacqulyn Liner, RN on: 03/08/2015  4:23 PM<BR>     Documentation filed: Notes Section

## 2015-03-11 ENCOUNTER — Ambulatory Visit
Admission: RE | Admit: 2015-03-11 | Discharge: 2015-03-11 | Disposition: A | Discharge status: Home / Self Care | Payer: Medicare Other | Source: Ambulatory Visit | Attending: Radiation Oncology | Admitting: Radiation Oncology

## 2015-03-11 DIAGNOSIS — Z510 Encounter for antineoplastic radiation therapy: Secondary | ICD-10-CM | POA: Diagnosis not present

## 2015-03-11 NOTE — Progress Notes (Addendum)
Dressing to bilateral, anterior Calves. Both sites cleansed with Sterile NS, dried, covered with dressing, then secured with hypafix tape  Note marked decrease in drainage and decreased size of ulcerated area on the left calf, and no drainage noted on non-treated right calf.  Daily dressings

## 2015-03-15 ENCOUNTER — Ambulatory Visit
Admission: RE | Admit: 2015-03-15 | Discharge: 2015-03-15 | Disposition: A | Discharge status: Home / Self Care | Payer: Medicare Other | Source: Ambulatory Visit | Attending: Radiation Oncology | Admitting: Radiation Oncology

## 2015-03-15 ENCOUNTER — Encounter: Payer: Self-pay | Admitting: Radiation Oncology

## 2015-03-15 VITALS — BP 140/64 | HR 58 | Temp 97.6°F | Resp 16

## 2015-03-15 DIAGNOSIS — Z510 Encounter for antineoplastic radiation therapy: Secondary | ICD-10-CM | POA: Diagnosis not present

## 2015-03-15 DIAGNOSIS — C44719 Basal cell carcinoma of skin of left lower limb, including hip: Secondary | ICD-10-CM

## 2015-03-15 NOTE — Progress Notes (Signed)
Weekly rad tx left lower calf , dressiing already on right leg, dry,intact, left leg ulcer  Area  open,  Decreased size of ulcerated area on  left calf per Patric Dykes, RN, will cleanse and redress after MD sees it, no drainage sen no c/o pain in legs, dry ,thin papery flaky skin, swelling in legs erythmea BP 140/64 mmHg  Pulse 58  Temp(Src) 97.6 F (36.4 C) (Oral)  Resp 16  Wt   SpO2 100%  Wt Readings from Last 3 Encounters:  01/21/15 169 lb 6.4 oz (76.839 kg)  08/22/13 167 lb 15.9 oz (76.2 kg)

## 2015-03-15 NOTE — Progress Notes (Signed)
Weekly Management Note:  Site: Left lower leg Current Dose:  3960  cGy Projected Dose: 4400  cGy  Narrative: The patient is seen today for routine under treatment assessment. CBCT/MVCT images/port films were reviewed. The chart was reviewed.   He is without new complaints today.  He is having less drainage.  Physical Examination:  Filed Vitals:   03/15/15 1347  BP: 140/64  Pulse: 58  Temp: 97.6 F (36.4 C)  Resp: 16  .  Weight:  .  The carcinoma remains ulcerated but somewhat flatter.  There is less drainage.  Impression: Tolerating radiation therapy well.  He'll finish his radiation therapy this Thursday.  Plan: Continue radiation therapy as planned.  One-month follow-up visit after completion of radiation therapy.

## 2015-03-18 ENCOUNTER — Encounter: Payer: Self-pay | Admitting: Radiation Oncology

## 2015-03-18 ENCOUNTER — Ambulatory Visit
Admission: RE | Admit: 2015-03-18 | Discharge: 2015-03-18 | Disposition: A | Discharge status: Home / Self Care | Payer: Medicare Other | Source: Ambulatory Visit | Attending: Radiation Oncology | Admitting: Radiation Oncology

## 2015-03-18 DIAGNOSIS — Z510 Encounter for antineoplastic radiation therapy: Secondary | ICD-10-CM | POA: Diagnosis not present

## 2015-03-18 NOTE — Progress Notes (Signed)
[facility] End of Treatment Note  Name:[name]  Date: 03/18/2015 HGD:[id number] DOB:1918/03/25   Status:outpatient    CC: [name], MD    REFERRING PHYSICIAN: Dr. Soledad Gerlach   DIAGNOSIS: Basal cell carcinoma the skin, left pretibial region   INDICATION FOR TREATMENT: Curative   TREATMENT DATES: 02/15/2015 through 03/18/2015                          SITE/DOSE:  Left pretibial region 4400 cGy in 10 sessions, two sessions a week  for 5 weeks                        BEAMS/ENERGY:    9 MEV electrons with 0.8 cm custom bolus to maximize the dose to the skin surface                NARRATIVE:   The patient tolerated treatment well with flattening and slight regression of his ulcerative lesion by completion of therapy.  I expect further regression over the next few months.  I explained to the patient and his daughter that it may take 3-6 months before we see a complete response.  Of note is that we do not treat his right pretibial region since there was no well-defined carcinoma or ulceration at the time of his initial evaluation.                         PLAN: Routine followup in one month. Patient instructed to call if questions or worsening complaints in interim.

## 2015-03-18 NOTE — Progress Notes (Signed)
Cleansed both lower leg wounds with normal saline.  Covered with drain sponges and secured with medipore tape.  Escorted patient and his daughter to ring the bell.

## 2015-04-27 ENCOUNTER — Encounter: Payer: Self-pay | Admitting: Radiation Oncology

## 2015-04-28 ENCOUNTER — Encounter: Payer: Self-pay | Admitting: Radiation Oncology

## 2015-04-28 ENCOUNTER — Ambulatory Visit
Admission: RE | Admit: 2015-04-28 | Discharge: 2015-04-28 | Disposition: A | Discharge status: Home / Self Care | Payer: Medicare Other | Source: Ambulatory Visit | Attending: Radiation Oncology | Admitting: Radiation Oncology

## 2015-04-28 VITALS — BP 103/53 | HR 70 | Temp 97.7°F

## 2015-04-28 DIAGNOSIS — C44719 Basal cell carcinoma of skin of left lower limb, including hip: Secondary | ICD-10-CM

## 2015-04-28 HISTORY — DX: Personal history of irradiation: Z92.3

## 2015-04-28 NOTE — Progress Notes (Signed)
CC: Dr. Murvin Donning  Follow-up note:  [facility] visits today almost 6 weeks following completion of hypofractionated radiation therapy to his left pretibial region in the management of his advanced base cell carcinoma.  His family tells me that he recently developed an ulcer along the medial aspect of his left ankle.  He continues to have some drainage along the lateral aspect of his lower leg.  He has not been to his wound center for any wound care.  He is really not receiving any home health nursing care.  Physical examination: Alert and oriented. Filed Vitals:   04/28/15 0946  BP: 103/53  Pulse: 70  Temp: 97.7 F (36.5 C)   On inspection of his left pretibial region there is a residual superficial 1.3 cm ulcer which continues to granulate.  This previously measured 4 x 3 cm and was nodular.  There is no obvious evidence for persistent disease.  On inspection of the medial aspect of his left ankle are 2 adjacent ulcers one measuring 2.5 cm in diameter and the other measuring 4.0 cm in diameter ( see photograph below).  I'm not sure if this represents a new skin cancer, infection, or stasis/pressure ulcer.   Since this is quite recent I doubt that this represents carcinoma.       Impression: Satisfactory response to his left anterior tibial basal cell carcinoma.  No obvious evidence for persistent carcinoma along the left anterior tibial region.  I explained to his family and the patient that he needs some attention and further evaluation of his ulcers along the medial left ankle.  Plan: I told his family and the patient that he needs to see Dr. Abbott Pao for a follow-up visit in the very near future with referral back to his wound center.  All ulcers were dressed today.  With my retirement, I will have him see Dr. Lisbeth Renshaw for a follow-up visit in approximately 2 months.

## 2015-04-28 NOTE — Progress Notes (Signed)
[name] here for reassessment s/p XRT to his  Left pretibial region.

## 2015-05-03 ENCOUNTER — Encounter: Payer: Medicare Other | Attending: Surgery | Admitting: Surgery

## 2015-05-03 DIAGNOSIS — I890 Lymphedema, not elsewhere classified: Secondary | ICD-10-CM | POA: Insufficient documentation

## 2015-05-03 DIAGNOSIS — I739 Peripheral vascular disease, unspecified: Secondary | ICD-10-CM | POA: Diagnosis not present

## 2015-05-03 DIAGNOSIS — Z87891 Personal history of nicotine dependence: Secondary | ICD-10-CM | POA: Insufficient documentation

## 2015-05-03 DIAGNOSIS — M1990 Unspecified osteoarthritis, unspecified site: Secondary | ICD-10-CM | POA: Diagnosis not present

## 2015-05-03 DIAGNOSIS — I120 Hypertensive chronic kidney disease with stage 5 chronic kidney disease or end stage renal disease: Secondary | ICD-10-CM | POA: Diagnosis not present

## 2015-05-03 DIAGNOSIS — C44712 Basal cell carcinoma of skin of right lower limb, including hip: Secondary | ICD-10-CM | POA: Insufficient documentation

## 2015-05-03 DIAGNOSIS — L97322 Non-pressure chronic ulcer of left ankle with fat layer exposed: Secondary | ICD-10-CM | POA: Diagnosis not present

## 2015-05-03 DIAGNOSIS — H3530 Unspecified macular degeneration: Secondary | ICD-10-CM | POA: Insufficient documentation

## 2015-05-03 DIAGNOSIS — C44719 Basal cell carcinoma of skin of left lower limb, including hip: Secondary | ICD-10-CM | POA: Insufficient documentation

## 2015-05-03 DIAGNOSIS — N189 Chronic kidney disease, unspecified: Secondary | ICD-10-CM | POA: Diagnosis not present

## 2015-05-03 DIAGNOSIS — L97212 Non-pressure chronic ulcer of right calf with fat layer exposed: Secondary | ICD-10-CM | POA: Diagnosis not present

## 2015-05-03 DIAGNOSIS — Z85828 Personal history of other malignant neoplasm of skin: Secondary | ICD-10-CM | POA: Insufficient documentation

## 2015-05-06 NOTE — Progress Notes (Signed)
[name], [name] ([id number]) Visit Report for 05/03/2015 Abuse/Suicide Risk Screen Details [name], [name] 05/03/2015 9:30 Patient Name: Date of Service: W. AM Medical Record Patient Account Number: [id number] Number: Treating RN: [name] Date of Birth/Sex: 1918-03-07 (81 y.o. Male) Other Clinician: Primary Care Treating [name], [name], [name] Physician: Physician/Extender: Referring Physician: Dede [name] in Treatment: 0 Abuse/Suicide Risk Screen Items Answer ABUSE/SUICIDE RISK SCREEN: Has anyone close to you tried to hurt or harm you recentlyo No Do you feel uncomfortable with anyone in your familyo No Has anyone forced you do things that you didnot want to doo No Do you have any thoughts of harming yourselfo No Patient displays signs or symptoms of abuse and/or neglect. No Electronic Signature(s) Signed: 05/05/2015 4:19:08 PM By: [name] Entered By: [name] on 05/03/2015 10:30:20 [name] ([id number]) -------------------------------------------------------------------------------- Activities of Daily Living Details [name], [name] 05/03/2015 9:30 Patient Name: Date of Service: W. AM Medical Record Patient Account Number: [id number] Number: Treating RN: [name] Date of Birth/Sex: 1918/04/20 (81 y.o. Male) Other Clinician: Primary Care Treating [name], [name], [place] Physician: Physician/Extender: Referring Physician: Dede [name] in Treatment: 0 Activities of Daily Living Items Answer Activities of Daily Living (Please select one for each item) Drive Automobile Not Able Take Medications Not Able Use Telephone Not Able Care for Appearance Not Able Use Toilet Not Able Manus Rudd / Shower Not Able Dress Self Not Able Feed Self Not Able Walk Not Able Get In / Out Bed Not Able Housework Not Able Prepare Meals Not Able Handle Money Not Able Shop for Self Not  Able Electronic Signature(s) Signed: 05/05/2015 4:19:08 PM By: [name] Entered By: [name] on 05/03/2015 10:30:47 [name] ([id number]) -------------------------------------------------------------------------------- Education Assessment Details [name], [name] 05/03/2015 9:30 Patient Name: Date of Service: W. AM Medical Record Patient Account Number: [id number] Number: Treating RN: [name] Date of Birth/Sex: 05-17-1917 (81 y.o. Male) Other Clinician: Primary Care Treating [name], [name], [place] Physician: Physician/Extender: Referring Physician: Dede [name] in Treatment: 0 Primary Learner Assessed: Patient Cognitive Barrier Assessment/Beliefs Language Barrier: No Translator Needed: No Memory Deficit: No Emotional Barrier: No Cultural/Religious Beliefs Affecting Medical No Care: Physical Barrier Assessment Impaired Vision: Yes Glasses Impaired Hearing: Yes Hearing Aid Decreased Hand dexterity: No Knowledge/Comprehension Assessment Knowledge Level: High Comprehension Level: High Ability to understand written High instructions: Ability to understand verbal High instructions: Motivation Assessment Anxiety Level: Anxious Cooperation: Cooperative Education Importance: Acknowledges Need Interest in Health Problems: Asks Questions Perception: Coherent Willingness to Engage in Self- High Management Activities: Readiness to Engage in Self- High Management Activities: Electronic Signature(s) Signed: 05/05/2015 4:19:08 PM By: [name], [name] W. ([id number]) Entered By: [name] on 05/03/2015 10:31:26 [name] ([id number]) -------------------------------------------------------------------------------- Fall Risk Assessment Details [name], [name] 05/03/2015 9:30 Patient Name: Date of Service: W. AM Medical Record Patient Account Number:  [id number] Number: Treating RN: [name] Date of Birth/Sex: 11-02-1917 (81 y.o. Male) Other Clinician: Primary Care Treating [name], [name], [place] Physician: Physician/Extender: Referring Physician: Dede [name] in Treatment: 0 Fall Risk Assessment Items Have you had 2 or more falls in the last 12 monthso 0 Yes Have you had any fall that resulted in injury in the last 12 monthso 0 No FALL RISK ASSESSMENT: History of falling - immediate or within 3 months 25 Yes Secondary diagnosis 15 Yes Ambulatory aid None/bed rest/wheelchair/nurse 0 Yes Crutches/cane/walker 15 Yes Furniture 0 No IV Access/Saline Lock 0 No Gait/Training Normal/bed rest/immobile 0 No Weak 10  Yes Impaired 20 Yes Mental Status Oriented to own ability 0 No Electronic Signature(s) Signed: 05/05/2015 4:19:08 PM By: [name] Entered By: [name] on 05/03/2015 10:31:53 [name], [name] W. ([id number]) -------------------------------------------------------------------------------- Foot Assessment Details [name], [name] 05/03/2015 9:30 Patient Name: Date of Service: W. AM Medical Record Patient Account Number: [id number] Number: Treating RN: [name] Date of Birth/Sex: 19-Mar-1918 (81 y.o. Male) Other Clinician: Primary Care Treating [place], [name], [place] Physician: Physician/Extender: Referring Physician: Dede [name] in Treatment: 0 Foot Assessment Items Site Locations + = Sensation present, - = Sensation absent, C = Callus, U = Ulcer R = Redness, W = Warmth, M = Maceration, PU = Pre-ulcerative lesion F = Fissure, S = Swelling, D = Dryness Assessment Right: Left: Other Deformity: No No Prior Foot Ulcer: No No Prior Amputation: No No Charcot Joint: No No Ambulatory Status: Ambulatory With Help Assistance Device: Wheelchair Gait: Administrator, arts) Signed: 05/05/2015 4:19:08 PM By: [name], [name] W. ([id number]) Entered By: [name] on 05/03/2015 10:33:07 [name], [name] ([id number]) -------------------------------------------------------------------------------- Nutrition Risk Assessment Details [name], [name] 05/03/2015 9:30 Patient Name: Date of Service: W. AM Medical Record Patient Account Number: [id number] Number: Treating RN: [name] Date of Birth/Sex: 06/21/1917 (81 y.o. Male) Other Clinician: Primary Care Treating [place], [name], [place] Physician: Physician/Extender: Referring Physician: Dede [name] in Treatment: 0 Height (in): 70 Weight (lbs): 180 Body Mass Index (BMI): 25.8 Nutrition Risk Assessment Items NUTRITION RISK SCREEN: I have an illness or condition that made me change the kind and/or 0 No amount of food I eat I eat fewer than two meals per day 0 No I eat few fruits and vegetables, or milk products 0 No I have three or more drinks of beer, liquor or wine almost every day 0 No I have tooth or mouth problems that make it hard for me to eat 0 No I don't always have enough money to buy the food I need 0 No I eat alone most of the time 0 No I take three or more different prescribed or over-the-counter drugs a 1 Yes day Without wanting to, I have lost or gained 10 pounds in the last six 0 No months I am not always physically able to shop, cook and/or feed myself 2 Yes Nutrition Protocols Good Risk Protocol Moderate Risk Protocol Electronic Signature(s) Signed: 05/05/2015 4:19:08 PM By: [name] Entered By: [name] on 05/03/2015 10:32:22

## 2015-05-06 NOTE — Progress Notes (Signed)
NOWELL, SALKOWSKI ([id number]) Visit Report for 05/03/2015 Chief Complaint Document Details TROYE, YAMASHIRO 05/03/2015 9:30 Patient Name: Date of Service: W. AM Medical Record Patient Account Number: [id number] Number: Treating RN: Ahmed Prima Date of Birth/Sex: 12/17/1917 (81 y.o. Male) Other Clinician: Primary Care Treating [place], Ernst Breach Physician: Physician/Extender: Referring Physician: Dede Query in Treatment: 0 Information Obtained from: Patient Chief Complaint Patient presents to the wound care center for a consult due non healing wound both lower extremities with swelling of the legs which she's had for several months Electronic Signature(s) Signed: 05/04/2015 8:32:56 AM By: Christin Fudge MD, FACS Previous Signature: 05/03/2015 11:15:34 AM Version By: Christin Fudge MD, FACS Entered By: Christin Fudge on 05/04/2015 08:32:56 [place], Luvern Viona Gilmore ([id number]) -------------------------------------------------------------------------------- HPI Details BURDGE, Dorian 05/03/2015 9:30 Patient Name: Date of Service: W. AM Medical Record Patient Account Number: [id number] Number: Treating RN: Ahmed Prima Date of Birth/Sex: 16-Sep-1917 (81 y.o. Male) Other Clinician: Primary Care Treating [place], Erline Hau, [place] Physician: Physician/Extender: Referring Physician: Dede Query in Treatment: 0 History of Present Illness Location: swelling of both lower extremities with ulceration around the ankle and the left lower extremity Quality: Patient reports experiencing a sharp pain to affected area(s). Severity: Patient states wound are getting worse. Duration: Patient has had the wound for > 3 months prior to seeking treatment at the wound center Timing: Pain in wound is constant (hurts all the time) Context: The wound appeared gradually over time Modifying Factors: Other treatment(s) tried include: compression  wraps in the past and radiation therapy to his basal cell carcinoma Associated Signs and Symptoms: Patient reports having increase discharge. HPI Description: 81 year old gentleman was being treated by the [facility] wound care clinic by Dr. Dellia Nims has not followed up since August 2016. He was diagnosis with basal cell carcinoma of both lower extremities and the left one has been treated with radiation therapy until about 6 weeks ago to the left pretibial region. During this time he developed an ulcer of his left ankle and continues to have a lot of drainage. He is also known to have a basal cell carcinoma of his right lower extremity near the shin. The patient does not want any aggressive surgical treatment or any further investigations and wants palliative care only. His past medical history significant for hypertension, osteoarthritis, varicose veins, venous stasis disease, bladder carcinoma, chronic kidney disease, chronic leg ulcers with biopsy-proven basal cell carcinoma. The patient was recently given a course of clindamycin. Electronic Signature(s) Signed: 05/04/2015 8:35:42 AM By: Christin Fudge MD, FACS Previous Signature: 05/03/2015 11:16:01 AM Version By: Christin Fudge MD, FACS Entered By: Christin Fudge on 05/04/2015 08:35:42 DANI, MIKRUT ([id number]) -------------------------------------------------------------------------------- Physical Exam Details MANES, Jasani 05/03/2015 9:30 Patient Name: Date of Service: W. AM Medical Record Patient Account Number: [id number] Number: Treating RN: Ahmed Prima Date of Birth/Sex: [birth year] (81 y.o. Male) Other Clinician: Primary Care Treating [place], Erline Hau, [place] Physician: Physician/Extender: Referring Physician: [name] Weeks in Treatment: 0 Constitutional . Pulse regular. Respirations normal and unlabored. Afebrile. . Eyes Nonicteric. Reactive to light. Ears, Nose, Mouth, and  Throat Lips, teeth, and gums WNL.Marland Kitchen Moist mucosa without lesions. Neck supple and nontender. No palpable supraclavicular or cervical adenopathy. Normal sized without goiter. Respiratory WNL. No retractions.. Cardiovascular Pedal Pulses WNL. no ABIs could be recorded due to severe pain in both lower extremities and unable to wrap the cuff around his lower extremity.. he has bilateral stage II lymphedema with a lot of weeping.. Gastrointestinal (GI) Abdomen  without masses or tenderness.. No liver or spleen enlargement or tenderness.. Lymphatic No adneopathy. No adenopathy. No adenopathy. Musculoskeletal Adexa without tenderness or enlargement.. Digits and nails w/o clubbing, cyanosis, infection, petechiae, ischemia, or inflammatory conditions.. Integumentary (Hair, Skin) No suspicious lesions. No crepitus or fluctuance. No peri-wound warmth or erythema. No masses.Marland Kitchen Psychiatric Judgement and insight Intact.. No evidence of depression, anxiety, or agitation.. Notes the patient has significant ulceration both lower extremities worse on the left than on the right. The AV another left ankle has significant amount of slough and is a rather large ulcerated area. The area on the right tibial region looks like an active basal cell carcinoma. Electronic Signature(s) Signed: 05/04/2015 8:36:59 AM By: Christin Fudge MD, FACS Entered By: Christin Fudge on 05/04/2015 08:36:59 SPIRIDON, GAUDREAULT ([id number]) Neila Gear, Harrel Viona Gilmore ([id number]) -------------------------------------------------------------------------------- Physician Orders Details RUDENKO, Khoi 05/03/2015 9:30 Patient Name: Date of Service: W. AM Medical Record Patient Account Number: [id number] Number: Treating RN: Ahmed Prima Date of Birth/Sex: 29-Sep-1917 (81 y.o. Male) Other Clinician: Primary Care Treating [place], Erline Hau, [place] Physician: Physician/Extender: Referring Physician: Dede Query  in Treatment: 0 Verbal / Phone Orders: Yes Clinician: [place], Debi Read Back and Verified: Yes Diagnosis Coding Wound Cleansing Wound #1 Left,Lateral Malleolus o Clean wound with Normal Saline. Wound #2 Left,Proximal,Midline Lower Leg o Clean wound with Normal Saline. Wound #3 Left,Distal,Circumferential Lower Leg o Clean wound with Normal Saline. Wound #4 Right,Proximal,Midline Lower Leg o Clean wound with Normal Saline. Anesthetic Wound #1 Left,Lateral Malleolus o Topical Lidocaine 4% cream applied to wound bed prior to debridement Wound #2 Left,Proximal,Midline Lower Leg o Topical Lidocaine 4% cream applied to wound bed prior to debridement Wound #3 Left,Distal,Circumferential Lower Leg o Topical Lidocaine 4% cream applied to wound bed prior to debridement Wound #4 Right,Proximal,Midline Lower Leg o Topical Lidocaine 4% cream applied to wound bed prior to debridement Primary Wound Dressing Wound #1 Left,Lateral Malleolus o Aquacel Ag Wound #2 Left,Proximal,Midline Lower Leg o Aquacel Ag Wound #3 Left,Distal,Circumferential Lower Leg [place]ns, Dreyden W. ([id number]) o Aquacel Ag Wound #4 Right,Proximal,Midline Lower Leg o Aquacel Ag Secondary Dressing Wound #1 Left,Lateral Malleolus o ABD pad Wound #2 Left,Proximal,Midline Lower Leg o ABD pad Wound #3 Left,Distal,Circumferential Lower Leg o ABD pad Wound #4 Right,Proximal,Midline Lower Leg o ABD pad Dressing Change Frequency Wound #1 Left,Lateral Malleolus o Three times weekly Wound #2 Left,Proximal,Midline Lower Leg o Three times weekly Wound #3 Left,Distal,Circumferential Lower Leg o Three times weekly Wound #4 Right,Proximal,Midline Lower Leg o Three times weekly Follow-up Appointments Wound #1 Left,Lateral Malleolus o Return Appointment in 1 week. Wound #2 Left,Proximal,Midline Lower Leg o Return Appointment in 1 week. Wound #3 Left,Distal,Circumferential  Lower Leg o Return Appointment in 1 week. Wound #4 Right,Proximal,Midline Lower Leg o Return Appointment in 1 week. Edema Control Wound #1 Left,Lateral Malleolus o 2 Layer Lite Compression System - Bilateral o Elevate legs to the level of the heart and pump ankles as often as possible Schnetzer, Daran W. ([id number]) Wound #2 Left,Proximal,Midline Lower Leg o 2 Layer Lite Compression System - Bilateral o Elevate legs to the level of the heart and pump ankles as often as possible Wound #3 Left,Distal,Circumferential Lower Leg o 2 Layer Lite Compression System - Bilateral o Elevate legs to the level of the heart and pump ankles as often as possible Wound #4 Right,Proximal,Midline Lower Leg o 2 Layer Lite Compression System - Bilateral o Elevate legs to the level of the heart and pump ankles as often as possible Home Health Wound #  [place] for [place] Nurse may visit PRN to address patientos wound care needs. o FACE TO FACE ENCOUNTER: MEDICARE and MEDICAID PATIENTS: I certify that this patient is under my care and that I had a face-to-face encounter that meets the physician face-to-face encounter requirements with this patient on this date. The encounter with the patient was in whole or in part for the following MEDICAL CONDITION: (primary reason for [place]) MEDICAL NECESSITY: I certify, that based on my findings, NURSING services are a medically necessary home health service. HOME BOUND STATUS: I certify that my clinical findings support that this patient is homebound (i.e., Due to illness or injury, pt requires aid of supportive devices such as crutches, cane, wheelchairs, walkers, the use of special transportation or the assistance of another person to leave their place of residence. There is a normal inability to leave the home and doing so requires considerable and taxing effort.  Other absences are for medical reasons / religious services and are infrequent or of short duration when for other reasons). o If current dressing causes regression in wound condition, may D/C ordered dressing product/s and apply Normal Saline Moist Dressing daily until next [place] / Other MD appointment. [place] of regression in wound condition at [phone]. o Please direct any NON-WOUND related issues/requests for orders to patient's Primary Care Physician Wound #2 Left,Proximal,Midline Lower Leg o [place] for [place] Nurse may visit PRN to address patientos wound care needs. o FACE TO FACE ENCOUNTER: MEDICARE and MEDICAID PATIENTS: I certify that this patient is under my care and that I had a face-to-face encounter that meets the physician face-to-face encounter requirements with this patient on this date. The encounter with the patient was in whole or in part for the following MEDICAL CONDITION: (primary reason for [place]) MEDICAL NECESSITY: I certify, that based on my findings, NURSING services are a medically necessary home health service. HOME BOUND STATUS: I certify that my clinical findings support that this patient is homebound (i.e., Due to illness or injury, pt requires aid of supportive devices such as crutches, cane, wheelchairs, walkers, the use of special transportation or the assistance of another person to leave their place of residence. There is a normal inability to leave the home and doing so requires considerable and taxing effort. FAVOUR, COTA ([id number]) absences are for medical reasons / religious services and are infrequent or of short duration when for other reasons). o If current dressing causes regression in wound condition, may D/C ordered dressing product/s and apply Normal Saline Moist Dressing daily until next [place] / Other  MD appointment. [place] of regression in wound condition at [phone]. o Please direct any NON-WOUND related issues/requests for orders to patient's Primary Care Physician Wound #3 Left,Distal,Circumferential Lower Leg o [place] for [place] Nurse may visit PRN to address patientos wound care needs. o FACE TO FACE ENCOUNTER: MEDICARE and MEDICAID PATIENTS: I certify that this patient is under my care and that I had a face-to-face encounter that meets the physician face-to-face encounter requirements with this patient on this date. The encounter with the patient was in whole or in part for the following MEDICAL CONDITION: (primary reason for [place]) MEDICAL NECESSITY: I certify, that based on my findings, NURSING services are a medically necessary home health service. HOME BOUND STATUS: I certify that  my clinical findings support that this patient is homebound (i.e., Due to illness or injury, pt requires aid of supportive devices such as crutches, cane, wheelchairs, walkers, the use of special transportation or the assistance of another person to leave their place of residence. There is a normal inability to leave the home and doing so requires considerable and taxing effort. Other absences are for medical reasons / religious services and are infrequent or of short duration when for other reasons). o If current dressing causes regression in wound condition, may D/C ordered dressing product/s and apply Normal Saline Moist Dressing daily until next [place] / Other MD appointment. [place] of regression in wound condition at [phone]. o Please direct any NON-WOUND related issues/requests for orders to patient's Primary Care Physician Wound #4 Right,Proximal,Midline Lower Leg o [place] for [place] Nurse may visit PRN to address patientos wound  care needs. o FACE TO FACE ENCOUNTER: MEDICARE and MEDICAID PATIENTS: I certify that this patient is under my care and that I had a face-to-face encounter that meets the physician face-to-face encounter requirements with this patient on this date. The encounter with the patient was in whole or in part for the following MEDICAL CONDITION: (primary reason for [place]) MEDICAL NECESSITY: I certify, that based on my findings, NURSING services are a medically necessary home health service. HOME BOUND STATUS: I certify that my clinical findings support that this patient is homebound (i.e., Due to illness or injury, pt requires aid of supportive devices such as crutches, cane, wheelchairs, walkers, the use of special transportation or the assistance of another person to leave their place of residence. There is a normal inability to leave the home and doing so requires considerable and taxing effort. Other absences are for medical reasons / religious services and are infrequent or of short duration when for other reasons). o If current dressing causes regression in wound condition, may D/C ordered dressing product/s and apply Normal Saline Moist Dressing daily until next [place] / Other MD appointment. [place] of regression in wound condition at [phone]. o Please direct any NON-WOUND related issues/requests for orders to patient's Primary Care Physician CIAN, LABRIE ([id number]) Electronic Signature(s) Signed: 05/04/2015 4:08:32 PM By: Christin Fudge MD, FACS Signed: 05/05/2015 4:19:08 PM By: Alric Quan Entered By: Alric Quan on 05/03/2015 11:13:32 [place], Octavius W. ([id number]) -------------------------------------------------------------------------------- Problem List Details GRIGSBY, Menelik 05/03/2015 9:30 Patient Name: Date of Service: W. AM Medical Record Patient Account Number:  [id number] Number: Treating RN: Ahmed Prima Date of Birth/Sex: 25-Oct-1917 (81 y.o. 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 ([id number]) Neila Gear, Brok W. ([id number]) -------------------------------------------------------------------------------- Progress Note Details H[place], Nahzir 05/03/2015 9:30 Patient Name: Date of Service: W.  AM Medical Record Patient Account Number: [id number] Number: Treating RN: Ahmed Prima Date of Birth/Sex: [birth year] (81 y.o. Male) Other Clinician: Primary Care Treating [place], Erline Hau, [place] Physician: Physician/Extender: Referring Physician: Dede Query in Treatment: 0 Subjective Chief Complaint Information obtained from Patient Patient presents to the wound care center for a consult due non healing wound both lower extremities with swelling of the legs which she's had for several months History of Present Illness (HPI) The following HPI elements were documented for the patient's  wound: Location: swelling of both lower extremities with ulceration around the ankle and the left lower extremity Quality: Patient reports experiencing a sharp pain to affected area(s). Severity: Patient states wound are getting worse. Duration: Patient has had the wound for > 3 months prior to seeking treatment at the wound center Timing: Pain in wound is constant (hurts all the time) Context: The wound appeared gradually over time Modifying Factors: Other treatment(s) tried include: compression wraps in the past and radiation therapy to his basal cell carcinoma Associated Signs and Symptoms: Patient reports having increase discharge. 81 year old gentleman was being treated by the [facility] wound care clinic by Dr. Dellia Nims has not followed up since August 2016. He was diagnosis with basal cell carcinoma of both lower extremities and the left one has been treated with radiation therapy until about 6 weeks ago to the left pretibial region. During this time he developed an ulcer of his left ankle and continues to have a lot of drainage. He is also known to have a basal cell carcinoma of his right lower extremity near the shin. The patient does not want any aggressive surgical treatment or any further investigations and wants palliative care only. His past medical history significant for hypertension, osteoarthritis, varicose veins, venous stasis disease, bladder carcinoma, chronic kidney disease, chronic leg ulcers with biopsy-proven basal cell carcinoma. The patient was recently given a course of clindamycin. Wound History Patient presents with 4 open wounds that have been present for approximately 1 yr. Patient has been treating wounds in the following manner: neosporin. The wounds have been healed in the past but have re- opened. Laboratory tests have not been performed in the last month. Patient reportedly has not tested positive for an antibiotic resistant organism. Patient reportedly has  not tested positive for osteomyelitis. Patient reportedly has had testing performed to evaluate circulation in the legs. Patient experiences the following problems associated with their wounds: swelling. LC, OBLANDER ([id number]) Patient History Information obtained from Patient, Caregiver. Allergies No allergies have been documented for the patient Family History Hypertension - Mother, Father, No family history of Cancer, Diabetes, Heart Disease, Hereditary Spherocytosis, Kidney Disease, Lung Disease, Seizures, Stroke, Thyroid Problems, Tuberculosis. Social History Former smoker, Marital Status - Married, Alcohol Use - Daily - wine, Drug Use - No History, Caffeine Use - Daily. Medical History Eyes Patient has history of Glaucoma Cardiovascular Patient has history of Hypertension Integumentary (Skin) Denies history of History of pressure wounds Musculoskeletal Patient has history of Osteoarthritis Medical And Surgical History Notes Eyes macular degeneration Oncologic hx bladder and skin cancer Review of Systems (ROS) Eyes Complains or has symptoms of Glasses / Contacts - glasses. Denies complaints or symptoms of Vision Changes. Ear/Nose/Mouth/Throat The patient has no complaints or symptoms. Hematologic/Lymphatic The patient has no complaints or symptoms. Respiratory The patient has no complaints or symptoms. Gastrointestinal The patient has no complaints or symptoms. Endocrine The patient has no complaints or symptoms. Genitourinary Complains or has symptoms of Incontinence/dribbling. Immunological The  patient has no complaints or symptoms. KENJIRO, TYLICKI. ([id number]) Integumentary (Skin) Complains or has symptoms of Wounds, Swelling. Musculoskeletal Complains or has symptoms of Muscle Weakness. Neurologic The patient has no complaints or symptoms. Psychiatric Complains or has symptoms of Anxiety. Medications MegaRed Omega-3 Krill Oil 1 1 the  patient is also on tramadol, Plavix, amlodipine, finasteride, furosemide, hydralazine, stool softener, Flomax, eyedrops. Objective Constitutional Pulse regular. Respirations normal and unlabored. Afebrile. Vitals Time Taken: 9:45 AM, Height: 70 in, Source: Stated, Weight: 180 lbs, Source: Stated, BMI: 25.8, Temperature: 97.5 F, Pulse: 59 bpm, Respiratory Rate: 18 breaths/min, Blood Pressure: 138/55 mmHg. Eyes Nonicteric. Reactive to light. Ears, Nose, Mouth, and Throat Lips, teeth, and gums WNL.Marland Kitchen Moist mucosa without lesions. Neck supple and nontender. No palpable supraclavicular or cervical adenopathy. Normal sized without goiter. Respiratory WNL. No retractions.. Cardiovascular Pedal Pulses WNL. no ABIs could be recorded due to severe pain in both lower extremities and unable to wrap the cuff around his lower extremity.. he has bilateral stage II lymphedema with a lot of weeping.. Gastrointestinal (GI) Abdomen without masses or tenderness.. No liver or spleen enlargement or tenderness.. Lymphatic [place], Fitz W. ([id number]) No adneopathy. No adenopathy. No adenopathy. Musculoskeletal Adexa without tenderness or enlargement.. Digits and nails w/o clubbing, cyanosis, infection, petechiae, ischemia, or inflammatory conditions.Marland Kitchen Psychiatric Judgement and insight Intact.. No evidence of depression, anxiety, or agitation.. General Notes: the patient has significant ulceration both lower extremities worse on the left than on the right. The AV another left ankle has significant amount of slough and is a rather large ulcerated area. The area on the right tibial region looks like an active basal cell carcinoma. Integumentary (Hair, Skin) No suspicious lesions. No crepitus or fluctuance. No peri-wound warmth or erythema. No masses.. Wound #1 status is Open. Original cause of wound was Gradually Appeared. The wound is located on the Left,Lateral Malleolus. The wound measures 5cm  length x 3.5cm width x 0.3cm depth; 13.744cm^2 area and 4.123cm^3 volume. The wound is limited to skin breakdown. There is no tunneling or undermining noted. There is a large amount of serous drainage noted. The wound margin is epibole. There is small (1-33%) red granulation within the wound bed. There is a large (67-100%) amount of necrotic tissue within the wound bed including Adherent Slough. The periwound skin appearance exhibited: Localized Edema, Maceration, Moist. Wound #2 status is Open. Original cause of wound was Radiation Burn. The wound is located on the Left,Proximal,Midline Lower Leg. The wound measures 1.2cm length x 1cm width x 0.1cm depth; 0.942cm^2 area and 0.094cm^3 volume. The wound is limited to skin breakdown. There is no tunneling or undermining noted. There is a large amount of serous drainage noted. There is medium (34-66%) red, pink granulation within the wound bed. There is a medium (34-66%) amount of necrotic tissue within the wound bed including Adherent Slough. The periwound skin appearance exhibited: Localized Edema, Maceration, Moist. Periwound temperature was noted as No Abnormality. The periwound has tenderness on palpation. Wound #3 status is Open. Original cause of wound was Radiation Burn. The wound is located on the Left,Distal,Circumferential Lower Leg. The wound measures 16cm length x 29.8cm width x 0.1cm depth; 374.478cm^2 area and 37.448cm^3 volume. There is no tunneling or undermining noted. There is a large amount of serous drainage noted. The wound margin is flat and intact. There is medium (34-66%) pink granulation within the wound bed. There is a medium (34-66%) amount of necrotic tissue within the wound bed including Adherent Slough. The periwound skin appearance  exhibited: Localized Edema, Maceration, Moist. Periwound temperature was noted as No Abnormality. The periwound has tenderness on palpation. Wound #4 status is Open. Original cause of wound  was Blister. The wound is located on the Right,Proximal,Midline Lower Leg. The wound measures 2cm length x 0.5cm width x 0.1cm depth; 0.785cm^2 area and 0.079cm^3 volume. There is no tunneling or undermining noted. There is a medium amount of serous drainage noted. The wound margin is flat and intact. There is large (67-100%) pink granulation within the wound bed. There is no necrotic tissue within the wound bed. The periwound skin appearance exhibited: Localized Edema, Moist. Periwound temperature was noted as No Abnormality. The periwound has tenderness on palpation. 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 He has completed radiation therapy to the left lower extremity and is going to be seeing the radiation therapist for treatment of the right lower extremity basal cell carcinoma. His advanced age and general ill health he and his family want only palliative care and here to see as for appropriate palliative therapy. I have recommended Aquacel Ag to both lower extremities with the application of a 2 layer light compression. He will follow with [place] on a regular basis and we will try and get home health to change his dressing for a total of 3 times a week. Plan Wound Cleansing: Wound #1 Left,Lateral Malleolus: Clean wound with Normal Saline. Wound #2 Left,Proximal,Midline Lower  Leg: Clean wound with Normal Saline. Wound #3 Left,Distal,Circumferential Lower Leg: Clean wound with Normal Saline. Wound #4 Right,Proximal,Midline Lower Leg: Clean wound with Normal Saline. Anesthetic: Wound #1 Left,Lateral Malleolus: Topical Lidocaine 4% cream applied to wound bed prior to debridement Wound #2 Left,Proximal,Midline Lower Leg: Topical Lidocaine 4% cream applied to wound bed prior to debridement Wound #3 Left,Distal,Circumferential Lower Leg: Topical Lidocaine 4% cream applied to wound bed prior to debridement Matteo, Azaiah W. ([id number]) Wound #4 Right,Proximal,Midline Lower Leg: Topical Lidocaine 4% cream applied to wound bed prior to debridement Primary Wound Dressing: Wound #1 Left,Lateral Malleolus: Aquacel Ag Wound #2 Left,Proximal,Midline Lower Leg: Aquacel Ag Wound #3 Left,Distal,Circumferential Lower Leg: Aquacel Ag Wound #4 Right,Proximal,Midline Lower Leg: Aquacel Ag Secondary Dressing: Wound #1 Left,Lateral Malleolus: ABD pad Wound #2 Left,Proximal,Midline Lower Leg: ABD pad Wound #3 Left,Distal,Circumferential Lower Leg: ABD pad Wound #4 Right,Proximal,Midline Lower Leg: ABD pad Dressing Change Frequency: Wound #1 Left,Lateral Malleolus: Three times weekly Wound #2 Left,Proximal,Midline Lower Leg: Three times weekly Wound #3 Left,Distal,Circumferential Lower Leg: Three times weekly Wound #4 Right,Proximal,Midline Lower Leg: Three times weekly Follow-up Appointments: Wound #1 Left,Lateral Malleolus: Return Appointment in 1 week. Wound #2 Left,Proximal,Midline Lower Leg: Return Appointment in 1 week. Wound #3 Left,Distal,Circumferential Lower Leg: Return Appointment in 1 week. Wound #4 Right,Proximal,Midline Lower Leg: Return Appointment in 1 week. Edema Control: Wound #1 Left,Lateral Malleolus: 2 Layer Lite Compression System - Bilateral Elevate legs to the level of the heart and pump ankles as often as possible Wound #2  Left,Proximal,Midline Lower Leg: 2  Layer Lite Compression System - Bilateral Elevate legs to the level of the heart and pump ankles as often as possible Wound #3 Left,Distal,Circumferential Lower Leg: 2 Layer Lite Compression System - Bilateral Elevate legs to the level of the heart and pump ankles as often as possible Wound #4 Right,Proximal,Midline Lower Leg: 2 Layer Lite Compression System - Bilateral Elevate legs to the level of the heart and pump ankles as often as possible Kamaili, [place] W. ([id number]) Home Health: Wound #1 Left,Lateral Malleolus: [place] for [place] Nurse may visit PRN to address patient s wound care needs. FACE TO FACE ENCOUNTER: MEDICARE and MEDICAID PATIENTS: I certify that this patient is under my care and that I had a face-to-face encounter that meets the physician face-to-face encounter requirements with this patient on this date. The encounter with the patient was in whole or in part for the following MEDICAL CONDITION: (primary reason for [place]) MEDICAL NECESSITY: I certify, that based on my findings, NURSING services are a medically necessary home health service. HOME BOUND STATUS: I certify that my clinical findings support that this patient is homebound (i.e., Due to illness or injury, pt requires aid of supportive devices such as crutches, cane, wheelchairs, walkers, the use of special transportation or the assistance of another person to leave their place of residence. There is a normal inability to leave the home and doing so requires considerable and taxing effort. Other absences are for medical reasons / religious services and are infrequent or of short duration when for other reasons). If current dressing causes regression in wound condition, may D/C ordered dressing product/s and apply Normal Saline Moist Dressing daily until next [place] / Other MD appointment. [place]  of regression in wound condition at [phone]. Please direct any NON-WOUND related issues/requests for orders to patient's Primary Care Physician Wound #2 Left,Proximal,Midline Lower Leg: [place] for [place] Nurse may visit PRN to address patient s wound care needs. FACE TO FACE ENCOUNTER: MEDICARE and MEDICAID PATIENTS: I certify that this patient is under my care and that I had a face-to-face encounter that meets the physician face-to-face encounter requirements with this patient on this date. The encounter with the patient was in whole or in part for the following MEDICAL CONDITION: (primary reason for [place]) MEDICAL NECESSITY: I certify, that based on my findings, NURSING services are a medically necessary home health service. HOME BOUND STATUS: I certify that my clinical findings support that this patient is homebound (i.e., Due to illness or injury, pt requires aid of supportive devices such as crutches, cane, wheelchairs, walkers, the use of special transportation or the assistance of another person to leave their place of residence. There is a normal inability to leave the home and doing so requires considerable and taxing effort. Other absences are for medical reasons / religious services and are infrequent or of short duration when for other reasons). If current dressing causes regression in wound condition, may D/C ordered dressing product/s and apply Normal Saline Moist Dressing daily until next [place] / Other MD appointment. [place] of regression in wound condition at [phone]. Please direct any NON-WOUND related issues/requests for orders to patient's Primary Care Physician Wound #3 Left,Distal,Circumferential Lower Leg: [place] for [place] Nurse may visit PRN to address patient s wound care needs. FACE TO FACE ENCOUNTER: MEDICARE and MEDICAID PATIENTS: I certify  that this patient is  under my care and that I had a face-to-face encounter that meets the physician face-to-face encounter requirements with this patient on this date. The encounter with the patient was in whole or in part for the following MEDICAL CONDITION: (primary reason for [place]) MEDICAL NECESSITY: I certify, that based on my findings, NURSING services are a medically necessary home health service. HOME BOUND STATUS: I certify that my clinical findings support that this patient is homebound (i.e., Due to illness or injury, pt requires aid of supportive devices such as crutches, cane, wheelchairs, walkers, the use of special transportation or the assistance of another person to leave their place of residence. There is a normal inability to leave the home and doing so requires considerable and taxing effort. Other absences are for medical reasons / religious services and are infrequent or of short duration when for other reasons). If current dressing causes regression in wound condition, may D/C ordered dressing product/s and apply Normal Saline Moist Dressing daily until next [place] / Other MD appointment. [place] of regression in wound condition at [phone]. TAHEEM, [place] ([id number]) Please direct any NON-WOUND related issues/requests for orders to patient's Primary Care Physician Wound #4 Right,Proximal,Midline Lower Leg: [place] for [place] Nurse may visit PRN to address patient s wound care needs. FACE TO FACE ENCOUNTER: MEDICARE and MEDICAID PATIENTS: I certify that this patient is under my care and that I had a face-to-face encounter that meets the physician face-to-face encounter requirements with this patient on this date. The encounter with the patient was in whole or in part for the following MEDICAL CONDITION: (primary reason for [place]) MEDICAL NECESSITY: I certify, that based on  my findings, NURSING services are a medically necessary home health service. HOME BOUND STATUS: I certify that my clinical findings support that this patient is homebound (i.e., Due to illness or injury, pt requires aid of supportive devices such as crutches, cane, wheelchairs, walkers, the use of special transportation or the assistance of another person to leave their place of residence. There is a normal inability to leave the home and doing so requires considerable and taxing effort. Other absences are for medical reasons / religious services and are infrequent or of short duration when for other reasons). If current dressing causes regression in wound condition, may D/C ordered dressing product/s and apply Normal Saline Moist Dressing daily until next [place] / Other MD appointment. [place] of regression in wound condition at [phone]. Please direct any NON-WOUND related issues/requests for orders to patient's Primary Care Physician This 81 year old gentleman who has advanced basal cell carcinoma both lower extremities was not found fit for any surgical procedure and the patient and his caregiver did not want any aggressive therapy. He has completed radiation therapy to the left lower extremity and is going to be seeing the radiation therapist for treatment of the right lower extremity basal cell carcinoma. His advanced age and general ill health he and his family want only palliative care and here to see as for appropriate palliative therapy. I have recommended Aquacel Ag to both lower extremities with the application of a 2 layer light compression. He will follow with [place] on a regular basis and we will try and get home health to change his dressing for a total of 3 times a week. Electronic Signature(s) Signed: 05/04/2015 8:39:55 AM By: Christin Fudge MD, FACS Entered By: Christin Fudge on 05/04/2015 08:39:55 Ruddick, Thedore W.  (  [id number]) -------------------------------------------------------------------------------- ROS/PFSH Details Ellerbe, Leonides 05/03/2015 9:30 Patient Name: Date of Service: W. AM Medical Record Patient Account Number: [id number] Number: Treating RN: Ahmed Prima Date of Birth/Sex: 07/02/1917 (81 y.o. Male) Other Clinician: Primary Care Treating Bartlett, Erline Hau, [place] Physician: Physician/Extender: Referring Physician: Dede Query in Treatment: 0 Information Obtained From Patient Caregiver Wound History Do you currently have one or more open woundso Yes How many open wounds do you currently haveo 4 Approximately how long have you had your woundso 1 yr How have you been treating your wound(s) until nowo neosporin Has your wound(s) ever healed and then re-openedo Yes Have you had any lab work done in the past montho No Have you tested positive for an antibiotic resistant organism (MRSA, VRE)o No Have you tested positive for osteomyelitis (bone infection)o No Have you had any tests for circulation on your legso Yes Who ordered the testo [facility] Where was the test doneo  Have you had other problems associated with your woundso Swelling Eyes Complaints and Symptoms: Positive for: Glasses / Contacts - glasses Negative for: Vision Changes Medical History: Positive for: Glaucoma Past Medical History Notes: macular degeneration Genitourinary Complaints and Symptoms: Positive for: Incontinence/dribbling Integumentary (Skin) Complaints and Symptoms: Positive for: Wounds; Swelling Medical HistoryZIHAO, MCCOLE ([id number]) Negative for: History of pressure wounds Musculoskeletal Complaints and Symptoms: Positive for: Muscle Weakness Medical History: Positive for: Osteoarthritis Psychiatric Complaints and Symptoms: Positive for: Anxiety Ear/Nose/Mouth/Throat Complaints and Symptoms: No Complaints or  Symptoms Hematologic/Lymphatic Complaints and Symptoms: No Complaints or Symptoms Respiratory Complaints and Symptoms: No Complaints or Symptoms Cardiovascular Medical History: Positive for: Hypertension Gastrointestinal Complaints and Symptoms: No Complaints or Symptoms Endocrine Complaints and Symptoms: No Complaints or Symptoms Immunological Complaints and Symptoms: No Complaints or Symptoms Neurologic Rybarczyk, Luchiano W. ([id number]) Complaints and Symptoms: No Complaints or Symptoms Oncologic Medical History: Past Medical History Notes: hx bladder and skin cancer HBO Extended History Items Eyes: Glaucoma Family and Social History Cancer: No; Diabetes: No; Heart Disease: No; Hereditary Spherocytosis: No; Hypertension: Yes - Mother, Father; Kidney Disease: No; Lung Disease: No; Seizures: No; Stroke: No; Thyroid Problems: No; Tuberculosis: No; Former smoker; Marital Status - Married; Alcohol Use: Daily - wine; Drug Use: No History; Caffeine Use: Daily; Financial Concerns: No; Food, Clothing or Shelter Needs: No; Support System Lacking: No; Transportation Concerns: No; Advanced Directives: No; Patient does not want information on Advanced Directives; Do not resuscitate: No; Living Will: Yes (Not Provided); Medical Power of Attorney: Yes (Not Provided) Physician Affirmation I have reviewed and agree with the above information. Electronic Signature(s) Signed: 05/03/2015 10:51:07 AM By: Christin Fudge MD, FACS Signed: 05/05/2015 4:19:08 PM By: Alric Quan Entered By: Christin Fudge on 05/03/2015 10:51:06 [name] ([id number]) -------------------------------------------------------------------------------- [place] Details Patient Name: [name] Date of Service: 05/03/2015 Medical Record Number: [id number] Patient Account Number: [id number] Date of Birth/Sex: 15-Feb-1918 (81 y.o. 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 I73.9 Peripheral vascular disease, unspecified Facility Procedures CPT4 Code: [id number] Description: [id number] - WOUND CARE VISIT-LEV 3 EST PT Modifier: Quantity: 1 Physician Procedures  CPT4 Code: [id number] Description: [id number] - WC PHYS LEVEL 4 - NEW PT ICD-10 Description Diagnosis I89.0 Lymphedema, not elsewhere classified [id number] Basal cell carcinoma of skin of right lower limb, C44.719 Basal cell carcinoma of skin of left lower limb, L97.322 Non-pressure  chronic ulcer of left ankle with fat Modifier: including hip including hip layer exposed Quantity: 1 Electronic Signature(s) Signed: 05/04/2015 8:40:13 AM By: Christin Fudge MD, FACS Entered By: Christin Fudge on 05/04/2015 08:40:13

## 2015-05-06 NOTE — Progress Notes (Signed)
[name], [name] ([id number]) Visit Report for 05/03/2015 Allergy List Details CARTIER, WASHKO 05/03/2015 9:30 Patient Name: Date of Service: W. AM Medical Record Patient Account Number: [id number] Number: Treating RN: Ahmed Prima Date of Birth/Sex: 12/14/1917 (81 y.o. Male) Other Clinician: Primary Care Physician: [name] Treating Christin Fudge Referring Physician: [name] Physician/Extender: Suella Grove in Treatment: 0 Electronic Signature(s) Signed: 05/05/2015 4:19:08 PM By: Alric Quan Entered By: Alric Quan on 05/03/2015 10:23:18 [name] ([id number]) -------------------------------------------------------------------------------- Arrival Information Details [name], [name] 05/03/2015 9:30 Patient Name: Date of Service: W. AM Medical Record Patient Account Number: [id number] Number: Treating RN: Ahmed Prima Date of Birth/Sex: 1917/06/10 (81 y.o. Male) Other Clinician: Primary Care Physician: Clydene Laming Referring Physician: [name] Physician/Extender: Suella Grove in Treatment: 0 Visit Information Patient Arrived: Wheel Chair Arrival Time: 09:37 Accompanied By: daughter/ [name] Transfer Assistance: EasyPivot Patient Lift Patient Identification Verified: Yes Secondary Verification Process Yes Completed: Patient Requires Transmission- No Based Precautions: Patient Has Alerts: Yes Patient Alerts: Patient on Blood Thinner plavix Electronic Signature(s) Signed: 05/05/2015 4:19:08 PM By: Alric Quan Entered By: Alric Quan on 05/03/2015 09:44:16 Aument, Mindy W. ([id number]) -------------------------------------------------------------------------------- Clinic Level of Care Assessment Details [name], [name] 05/03/2015 9:30 Patient Name: Date of Service: W. AM Medical Record Patient Account Number: [id number] Number: Treating RN: Ahmed Prima Date of Birth/Sex: 07-Jun-1917 (81 y.o. Male) Other Clinician: Primary Care Physician: [name] Treating Britto, Errol Referring Physician: [name] Physician/Extender: Suella Grove in Treatment: 0 Clinic Level of Care Assessment Items TOOL 2 Quantity Score _0  - Use when only an EandM is performed on the INITIAL visit 0 ASSESSMENTS - Nursing Assessment / Reassessment _1  - General Physical Exam (combine w/ comprehensive assessment (listed just 0 below) when performed on new pt. evals) X - Comprehensive Assessment (HX, ROS, Risk Assessments, Wounds Hx, etc.) 1 25 ASSESSMENTS - Wound and Skin Assessment / Reassessment _2  - Simple Wound Assessment / Reassessment - one wound 0 X - Complex Wound Assessment / Reassessment - multiple wounds 1 5 _3  - Dermatologic / Skin Assessment (not related to wound area) 0 ASSESSMENTS - Ostomy and/or Continence Assessment and Care _4  - Incontinence Assessment and Management 0 _5  - Ostomy Care Assessment and Management (repouching, etc.) 0 PROCESS - Coordination of Care _6  - Simple Patient / Family Education for ongoing care 0 X - Complex (extensive) Patient / Family Education for ongoing care 1 20 _7  - Staff obtains Programmer, systems, Records, Test Results / Process Orders 0 _8  - Staff telephones HHA, Nursing Homes / Clarify orders / etc 0 _9  - Routine Transfer to another Facility (non-emergent condition) 0 _10  - Routine Hospital Admission (non-emergent condition) 0 _11  - New Admissions / Biomedical engineer / Ordering NPWT, Apligraf, etc. 0 _12  - Emergency Hospital Admission (emergent condition) 0 [name], [name] W. ([id number]) X - Simple Discharge Coordination 1 10 _13  - Complex (extensive) Discharge Coordination 0 PROCESS - Special Needs _14  - Pediatric / Minor Patient Management 0 _15  - Isolation Patient Management 0 _16  - Hearing / Language / Visual special needs 0 _17  - Assessment of Community assistance (transportation, D/C planning, etc.) 0 _18  -  Additional assistance / Altered mentation 0 _19  - Support Surface(s) Assessment (bed, cushion, seat, etc.) 0 INTERVENTIONS - Wound Cleansing / Measurement X - Wound Imaging (photographs - any number of wounds) 1 5 _20  - Wound Tracing (instead of photographs) 0 _21  - Simple Wound Measurement - one wound 0 X - Complex Wound Measurement - multiple wounds 1 5 _22  -  Simple Wound Cleansing - one wound 0 _0  - Complex Wound Cleansing - multiple wounds 0 INTERVENTIONS - Wound Dressings _1  - Small Wound Dressing one or multiple wounds 0 X - Medium Wound Dressing one or multiple wounds 1 15 _2  - Large Wound Dressing one or multiple wounds 0 <[id number]>_3<\/GHWEXHBZJIRCVELF>_8  - Application of Medications - injection 0 INTERVENTIONS - Miscellaneous _4  - External ear exam 0 _5  - Specimen Collection (cultures, biopsies, blood, body fluids, etc.) 0 _6  - Specimen(s) / Culture(s) sent or taken to Lab for analysis 0 _7  - Patient Transfer (multiple staff / Harrel Lemon Lift / Similar devices) 0 _8  - Simple Staple / Suture removal (25 or less) 0 Gulyas, Kylon W. ([id number]) _9  - Complex Staple / Suture removal (26 or more) 0 _10  - Hypo / Hyperglycemic Management (close monitor of Blood Glucose) 0 _11  - Ankle / Brachial Index (ABI) - do not check if billed separately 0 Has the patient been seen at the hospital within the last three years: Yes Total Score: 85 Level Of Care: New/Established - Level 3 Electronic Signature(s) Signed: 05/05/2015 4:19:08 PM By: Alric Quan Entered By: Alric Quan on 05/03/2015 11:15:21 [place], Cage W. ([id number]) -------------------------------------------------------------------------------- Encounter Discharge Information Details SCHWINN, Juelz 05/03/2015 9:30 Patient Name: Date of Service: W. AM Medical Record Patient Account Number: [id number] Number: Treating RN: Ahmed Prima Date of Birth/Sex: Aug 01, 1917 (81 y.o. Male) Other Clinician: Primary Care Physician: [name] Treating Britto, Errol Referring Physician: [name] Physician/Extender: Suella Grove in Treatment: 0 Encounter Discharge Information Items Discharge Pain Level: 0 Discharge Condition: Stable Ambulatory Status: Wheelchair Discharge Destination: Home Transportation: Private Auto Accompanied By: daughter Schedule Follow-up Appointment: Yes Medication Reconciliation completed and provided to Patient/Care Yes Breah Joa: Provided on Clinical Summary of Care: 05/03/2015 Form Type Recipient Paper Patient [facility] Electronic Signature(s) Signed: 05/03/2015 11:39:47 AM By: Ruthine Dose Entered By: Ruthine Dose on 05/03/2015 11:39:47 Zavaleta, Rondal W. ([id number]) -------------------------------------------------------------------------------- Lower Extremity Assessment Details Lyon, Holdan 05/03/2015 9:30 Patient Name: Date of Service: W. AM Medical Record Patient Account Number: [id number] Number: Treating RN: Ahmed Prima Date of Birth/Sex: 11-14-1917 (81 y.o. Male) Other Clinician: Primary Care Physician: [name] Treating Britto, Errol Referring Physician: [name] Physician/Extender: Suella Grove in Treatment: 0 Edema Assessment Assessed: [Left: No] [Right: No] Edema: [Left: Yes] [Right: Yes] Calf Left: Right: Point of Measurement: 34 cm From Medial Instep 41.5 cm 41 cm Ankle Left: Right: Point of Measurement: 10 cm From Medial Instep 29.7 cm 30 cm Vascular Assessment Pulses: Posterior Tibial Dorsalis Pedis Palpable: [Left:No] [Right:No] Doppler: [Left:Monophasic] [Right:Monophasic] Extremity colors, hair growth, and conditions: Hair Growth on Extremity: [Left:No] [Right:No] Temperature of Extremity: [Left:Warm] [Right:Warm] Capillary Refill: [Left:< 3 seconds] [Right:< 3 seconds] Blood Pressure: Brachial: [Left:115] [Right:118] Toe Nail Assessment Left: Right: Thick: Yes Yes Discolored: Yes Yes Deformed: No No Improper Length and  Hygiene: Yes Yes Electronic Signature(s) Signed: 05/05/2015 4:19:08 PM By: Hilaria Ota, Oberon W. ([id number]) Entered By: Alric Quan on 05/03/2015 10:52:33 Elizarraraz, Han W. ([id number]) -------------------------------------------------------------------------------- Multi Wound Chart Details Everard, Travaughn 05/03/2015 9:30 Patient Name: Date of Service: W. AM Medical Record Patient Account Number: [id number] Number: Treating RN: Ahmed Prima Date of Birth/Sex: 1918/03/24 (81 y.o. Male) Other Clinician: Primary Care Physician: [name] Treating Britto, Errol Referring Physician: [name] Physician/Extender: Weeks in Treatment: 0 Vital Signs Height(in): 70 Pulse(bpm): 59 Weight(lbs): 180 Blood Pressure 138/55 (mmHg): Body Mass Index(BMI): 26 Temperature(F): 97.5 Respiratory Rate 18 (breaths/min): Photos: [1:No Photos] [2:No Photos] [3:No Photos] Wound Location: [1:Left Malleolus - Lateral] [2:Left  Lower Leg - Midline, Proximal] [3:Left Lower Leg - Distal, Circumfernential] Wounding Event: [1:Gradually Appeared] [2:Radiation Burn] [3:Radiation Burn] Primary Etiology: [1:To be determined] [2:Trauma, Other] [3:Trauma, Other] Date Acquired: [1:04/12/2015] [2:05/02/2014] [3:05/02/2014] Weeks of Treatment: [1:0] [2:0] [3:0] Wound Status: [1:Open] [2:Open] [3:Open] Measurements L x W x D 5x3.5x0.3 [2:1.2x1x0.1] [3:16x29.8x0.1] (cm) Area (cm) : [1:13.744] [2:0.942] [3:374.478] Volume (cm) : [1:4.123] [2:0.094] [3:37.448] % Reduction in Area: [1:N/A] [2:0.00%] [3:N/A] % Reduction in Volume: N/A [2:0.00%] [3:N/A] Classification: [1:Partial Thickness] [2:Partial Thickness] [3:Partial Thickness] Exudate Amount: [1:Large] [2:Large] [3:Large] Exudate Type: [1:Serous] [2:Serous] [3:Serous] Exudate Color: [1:amber] [2:amber] [3:amber] Foul Odor After [1:No] [2:Yes] [3:Yes] Cleansing: Odor Anticipated Due to N/A [2:No]  [3:No] Product Use: Wound Margin: [1:Epibole] [2:N/A] [3:Flat and Intact] Granulation Amount: [1:Small (1-33%)] [2:Medium (34-66%)] [3:Medium (34-66%)] Granulation Quality: [1:Red] [2:Red, Pink] [3:Pink] Necrotic Amount: [1:Large (67-100%)] [2:Medium (34-66%)] [3:Medium (34-66%)] Exposed Structures: [1:Fascia: No Fat: No] [2:Fascia: No Fat: No] [3:N/A] Tendon: No Tendon: No Muscle: No Muscle: No Joint: No Joint: No Bone: No Bone: No Limited to Skin Limited to Skin Breakdown Breakdown Epithelialization: None None None Periwound Skin Texture: Edema: Yes Edema: Yes Edema: Yes Periwound Skin Maceration: Yes Maceration: Yes Maceration: Yes Moisture: Moist: Yes Moist: Yes Moist: Yes Periwound Skin Color: No Abnormalities Noted No Abnormalities Noted No Abnormalities Noted Temperature: N/A No Abnormality No Abnormality Tenderness on No Yes Yes Palpation: Wound Preparation: Ulcer Cleansing: Ulcer Cleansing: Ulcer Cleansing: Rinsed/Irrigated with Rinsed/Irrigated with Rinsed/Irrigated with Saline Saline Saline Topical Anesthetic Topical Anesthetic Topical Anesthetic Applied: Other: lidocaine Applied: Other: lidocaine Applied: Other: lidocaine 4% 4% 4% Wound Number: 4 N/A N/A Photos: No Photos N/A N/A Wound Location: Right Lower Leg - Midline, N/A N/A Proximal Wounding Event: Blister N/A N/A Primary Etiology: To be determined N/A N/A Date Acquired: 05/02/2014 N/A N/A Weeks of Treatment: 0 N/A N/A Wound Status: Open N/A N/A Measurements L x W x D 2x0.5x0.1 N/A N/A (cm) Area (cm) : 0.785 N/A N/A Volume (cm) : 0.079 N/A N/A % Reduction in Area: N/A N/A N/A % Reduction in Volume: N/A N/A N/A Classification: Partial Thickness N/A N/A Exudate Amount: Medium N/A N/A Exudate Type: Serous N/A N/A Exudate Color: amber N/A N/A Foul Odor After No N/A N/A Cleansing: Odor Anticipated Due to N/A N/A N/A Product Use: Wound Margin: Flat and Intact N/A N/A Granulation Amount:  Large (67-100%) N/A N/A Granulation Quality: Pink N/A N/A Necrotic Amount: None Present (0%) N/A N/A Ala, Reyce W. ([id number]) Exposed Structures: N/A N/A N/A Epithelialization: None N/A N/A Periwound Skin Texture: Edema: Yes N/A N/A Periwound Skin Moist: Yes N/A N/A Moisture: Periwound Skin Color: No Abnormalities Noted N/A N/A Temperature: No Abnormality N/A N/A Tenderness on Yes N/A N/A Palpation: Wound Preparation: Ulcer Cleansing: N/A N/A Rinsed/Irrigated with Saline Topical Anesthetic Applied: Other: lidocaine 4% Treatment Notes Electronic Signature(s) Signed: 05/05/2015 4:19:08 PM By: Alric Quan Entered By: Alric Quan on 05/03/2015 10:33:24 [name] ([id number]) -------------------------------------------------------------------------------- Multi-Disciplinary Care Plan Details CARVER, Takao 05/03/2015 9:30 Patient Name: Date of Service: W. AM Medical Record Patient Account Number: [id number] Number: Treating RN: Ahmed Prima Date of Birth/Sex: Feb 18, 1918 (81 y.o. Male) Other Clinician: Primary Care Physician: [name] Treating Britto, Errol Referring Physician: [name] Physician/Extender: Suella Grove in Treatment: 0 Active Inactive Abuse / Safety / Falls / Self Care Management Nursing Diagnoses: Impaired physical mobility Potential for falls Goals: Patient will remain injury free Date Initiated: 05/03/2015 Goal Status: Active Patient/caregiver will verbalize understanding of skin care regimen Date Initiated: 05/03/2015 Goal Status: Active Interventions: Assess  fall risk on admission and as needed Assess: immobility, friction, shearing, incontinence upon admission and as needed Notes: Nutrition Nursing Diagnoses: Imbalanced nutrition Goals: Patient/caregiver agrees to and verbalizes understanding of need to obtain nutritional consultation Date Initiated: 05/03/2015 Goal Status:  Active Interventions: Provide education on nutrition Notes: DONIVIN, WIRT ([id number]) Orientation to the Wound Care Program Nursing Diagnoses: Knowledge deficit related to the wound healing center program Goals: Patient/caregiver will verbalize understanding of the [facility] Date Initiated: 05/03/2015 Goal Status: Active Interventions: Provide education on orientation to the wound center Notes: Pain, Acute or Chronic Nursing Diagnoses: Pain Management - Cyclic Acute (Dressing Change Related) Pain Management - Non-cyclic Acute (Procedural) Goals: Patient will verbalize adequate pain control and receive pain control interventions during procedures as needed Date Initiated: 05/03/2015 Goal Status: Active Patient/caregiver will verbalize comfort level met Date Initiated: 05/03/2015 Goal Status: Active Interventions: Assess comfort goal upon admission Provide education on pain management Notes: Wound/Skin Impairment Nursing Diagnoses: Impaired tissue integrity Knowledge deficit related to ulceration/compromised skin integrity Goals: Patient/caregiver will verbalize understanding of skin care regimen Date Initiated: 05/03/2015 Goal Status: Active Ulcer/skin breakdown will have a volume reduction of 30% by week 4 LOGYN, KENDRICK ([id number]) Date Initiated: 05/03/2015 Goal Status: Active Ulcer/skin breakdown will have a volume reduction of 50% by week 8 Date Initiated: 05/03/2015 Goal Status: Active Ulcer/skin breakdown will have a volume reduction of 80% by week 12 Date Initiated: 05/03/2015 Goal Status: Active Interventions: Assess patient/caregiver ability to obtain necessary supplies Assess patient/caregiver ability to perform ulcer/skin care regimen upon admission and as needed Notes: Electronic Signature(s) Signed: 05/05/2015 4:19:08 PM By: Alric Quan Entered By: Alric Quan on 05/03/2015 17:14:42 Nibley, McMullen.  ([id number]) -------------------------------------------------------------------------------- Pain Assessment Details Gellatly, Reinaldo 05/03/2015 9:30 Patient Name: Date of Service: W. AM Medical Record Patient Account Number: [id number] Number: Treating RN: Ahmed Prima Date of Birth/Sex: Apr 12, 1918 (81 y.o. Male) Other Clinician: Primary Care Physician: [name] Treating Britto, Errol Referring Physician: [name] Physician/Extender: Weeks in Treatment: 0 Active Problems Location of Pain Severity and Description of Pain Patient Has Paino Yes Site Locations Pain Location: Pain in Ulcers With Dressing Change: Yes Duration of the Pain. Constant / Intermittento Constant Rate the pain. Current Pain Level: 4 Worst Pain Level: 10 Least Pain Level: 2 Character of Pain Describe the Pain: Sharp, Throbbing Pain Management and Medication Current Pain Management: Electronic Signature(s) Signed: 05/05/2015 4:19:08 PM By: Alric Quan Entered By: Alric Quan on 05/03/2015 09:45:14 Dimitri, Darsh W. ([id number]) -------------------------------------------------------------------------------- Patient/Caregiver Education Details DEBORD, Frederic 05/03/2015 9:30 Patient Name: Date of Service: W. AM Medical Record Patient Account Number: [id number] Number: Treating RN: Ahmed Prima Date of Birth/Gender: 28-Aug-1917 (81 y.o. Male) Other Clinician: Primary Care Physician: [name] Treating Britto, Errol Referring Physician: [name] Physician/Extender: Suella Grove in Treatment: 0 Education Assessment Education Provided To: Patient and Caregiver daughter Education Topics Provided Wound/Skin Impairment: Handouts: Other: dont get wraps wet Methods: Demonstration, Explain/Verbal Responses: State content correctly Electronic Signature(s) Signed: 05/05/2015 4:19:08 PM By: Alric Quan Entered By: Alric Quan on 05/03/2015  11:17:31 Hiawatha, Terron W. ([id number]) -------------------------------------------------------------------------------- Wound Assessment Details Gianino, Tobie 05/03/2015 9:30 Patient Name: Date of Service: W. AM Medical Record Patient Account Number: [id number] Number: Treating RN: Ahmed Prima Date of Birth/Sex: 08/04/1917 (81 y.o. Male) Other Clinician: Primary Care Physician: [name] Treating Christin Fudge Referring Physician: [name] Physician/Extender: Weeks in Treatment: 0 Wound Status Wound Number: 1 Primary Etiology: To be determined Wound Location: Left Malleolus - Lateral Wound  Status: Open Wounding Event: Gradually Appeared Date Acquired: 04/12/2015 Weeks Of Treatment: 0 Clustered Wound: No Wound Measurements Length: (cm) 5 Width: (cm) 3.5 Depth: (cm) 0.3 Area: (cm) 13.744 Volume: (cm) 4.123 % Reduction in Area: % Reduction in Volume: Epithelialization: None Tunneling: No Undermining: No Wound Description Classification: Partial Thickness Wound Margin: Epibole Exudate Amount: Large Exudate Type: Serous Exudate Color: amber Foul Odor After Cleansing: No Wound Bed Granulation Amount: Small (1-33%) Exposed Structure Granulation Quality: Red Fascia Exposed: No Necrotic Amount: Large (67-100%) Fat Layer Exposed: No Necrotic Quality: Adherent Slough Tendon Exposed: No Muscle Exposed: No Joint Exposed: No Bone Exposed: No Limited to Skin Breakdown Periwound Skin Texture Texture Color No Abnormalities Noted: No No Abnormalities Noted: No Localized Edema: Yes Moisture Shinault, Mohab W. ([id number]) No Abnormalities Noted: No Maceration: Yes Moist: Yes Wound Preparation Ulcer Cleansing: Rinsed/Irrigated with Saline Topical Anesthetic Applied: Other: lidocaine 4%, Treatment Notes Wound #1 (Left, Lateral Malleolus) 1. Cleansed with: Clean wound with Normal Saline 2. Anesthetic Topical Lidocaine 4% cream  to wound bed prior to debridement 4. Dressing Applied: Aquacel Ag 5. Secondary Dressing Applied ABD Pad 7. Secured with 2 Layer Lite Compression System - Bilateral Electronic Signature(s) Signed: 05/05/2015 4:19:08 PM By: Alric Quan Entered By: Alric Quan on 05/03/2015 10:02:57 Cassara, Baird W. ([id number]) -------------------------------------------------------------------------------- Wound Assessment Details Shankle, Blayne 05/03/2015 9:30 Patient Name: Date of Service: W. AM Medical Record Patient Account Number: [id number] Number: Treating RN: Ahmed Prima Date of Birth/Sex: [birth year] (81 y.o. Male) Other Clinician: Primary Care Physician: [name] Treating Britto, Errol Referring Physician: [name] Physician/Extender: Weeks in Treatment: 0 Wound Status Wound Number: 2 Primary Etiology: Trauma, Other Wound Location: Left Lower Leg - Midline, Wound Status: Open Proximal Wounding Event: Radiation Burn Date Acquired: 05/02/2014 Weeks Of Treatment: 0 Clustered Wound: No Wound Measurements Length: (cm) 1.2 Width: (cm) 1 Depth: (cm) 0.1 Area: (cm) 0.942 Volume: (cm) 0.094 % Reduction in Area: 0% % Reduction in Volume: 0% Epithelialization: None Tunneling: No Undermining: No Wound Description Classification: Partial Thickness Foul Odor Aft Exudate Amount: Large Due to Produc Exudate Type: Serous Exudate Color: amber er Cleansing: Yes t Use: No Wound Bed Granulation Amount: Medium (34-66%) Exposed Structure Granulation Quality: Red, Pink Fascia Exposed: No Necrotic Amount: Medium (34-66%) Fat Layer Exposed: No Necrotic Quality: Adherent Slough Tendon Exposed: No Muscle Exposed: No Joint Exposed: No Bone Exposed: No Limited to Skin Breakdown Periwound Skin Texture Texture Color No Abnormalities Noted: No No Abnormalities Noted: No Localized Edema: Yes Temperature / Pain Moisture Temperature: No  Abnormality Fehring, Rodric W. ([id number]) No Abnormalities Noted: No Tenderness on Palpation: Yes Maceration: Yes Moist: Yes Wound Preparation Ulcer Cleansing: Rinsed/Irrigated with Saline Topical Anesthetic Applied: Other: lidocaine 4%, Treatment Notes Wound #2 (Left, Proximal, Midline Lower Leg) 1. Cleansed with: Clean wound with Normal Saline 2. Anesthetic Topical Lidocaine 4% cream to wound bed prior to debridement 4. Dressing Applied: Aquacel Ag 5. Secondary Dressing Applied ABD Pad 7. Secured with 2 Layer Lite Compression System - Bilateral Electronic Signature(s) Signed: 05/05/2015 4:19:08 PM By: Alric Quan Entered By: Alric Quan on 05/03/2015 10:08:37 [name] ([id number]) -------------------------------------------------------------------------------- Wound Assessment Details Buddenhagen, Christianjames 05/03/2015 9:30 Patient Name: Date of Service: W. AM Medical Record Patient Account Number: [id number] Number: Treating RN: Ahmed Prima Date of Birth/Sex: [birth year] (81 y.o. Male) Other Clinician: Primary Care Physician: [name] Treating Britto, Errol Referring Physician: [name] Physician/Extender: Weeks in Treatment: 0 Wound Status Wound Number: 3 Primary Etiology: Trauma, Other Wound Location: Left Lower  Leg - Distal, Wound Status: Open Circumfernential Wounding Event: Radiation Burn Date Acquired: 05/02/2014 Weeks Of Treatment: 0 Clustered Wound: No Wound Measurements Length: (cm) 16 Width: (cm) 29.8 Depth: (cm) 0.1 Area: (cm) 374.478 Volume: (cm) 37.448 % Reduction in Area: % Reduction in Volume: Epithelialization: None Tunneling: No Undermining: No Wound Description Classification: Partial Thickness Foul Odor Af Wound Margin: Flat and Intact Due to Produ Exudate Amount: Large Exudate Type: Serous Exudate Color: amber ter Cleansing: Yes ct Use: No Wound Bed Granulation Amount: Medium  (34-66%) Granulation Quality: Pink Necrotic Amount: Medium (34-66%) Necrotic Quality: Adherent Slough Periwound Skin Texture Texture Color No Abnormalities Noted: No No Abnormalities Noted: No Localized Edema: Yes Temperature / Pain Moisture Temperature: No Abnormality No Abnormalities Noted: No Tenderness on Palpation: Yes Maceration: Yes Moist: Yes Cariker, Philippe W. ([id number]) Wound Preparation Ulcer Cleansing: Rinsed/Irrigated with Saline Topical Anesthetic Applied: Other: lidocaine 4%, Treatment Notes Wound #3 (Left, Distal, Circumferential Lower Leg) 1. Cleansed with: Clean wound with Normal Saline 2. Anesthetic Topical Lidocaine 4% cream to wound bed prior to debridement 4. Dressing Applied: Aquacel Ag 5. Secondary Dressing Applied ABD Pad 7. Secured with 2 Layer Lite Compression System - Bilateral Electronic Signature(s) Signed: 05/05/2015 4:19:08 PM By: Alric Quan Entered By: Alric Quan on 05/03/2015 10:11:09 [name] ([id number]) -------------------------------------------------------------------------------- Wound Assessment Details Power, Thompson 05/03/2015 9:30 Patient Name: Date of Service: W. AM Medical Record Patient Account Number: [id number] Number: Treating RN: Ahmed Prima Date of Birth/Sex: 1918/04/21 (81 y.o. Male) Other Clinician: Primary Care Physician: [name] Treating Britto, Errol Referring Physician: [name] Physician/Extender: Weeks in Treatment: 0 Wound Status Wound Number: 4 Primary Etiology: To be determined Wound Location: Right Lower Leg - Midline, Wound Status: Open Proximal Wounding Event: Blister Date Acquired: 05/02/2014 Weeks Of Treatment: 0 Clustered Wound: No Wound Measurements Length: (cm) 2 Width: (cm) 0.5 Depth: (cm) 0.1 Area: (cm) 0.785 Volume: (cm) 0.079 % Reduction in Area: % Reduction in Volume: Epithelialization: None Tunneling:  No Undermining: No Wound Description Classification: Partial Thickness Wound Margin: Flat and Intact Exudate Amount: Medium Exudate Type: Serous Exudate Color: amber Foul Odor After Cleansing: No Wound Bed Granulation Amount: Large (67-100%) Granulation Quality: Pink Necrotic Amount: None Present (0%) Periwound Skin Texture Texture Color No Abnormalities Noted: No No Abnormalities Noted: No Localized Edema: Yes Temperature / Pain Moisture Temperature: No Abnormality No Abnormalities Noted: No Tenderness on Palpation: Yes Moist: Yes Wound Preparation Kawashima, Laura W. ([id number]) Ulcer Cleansing: Rinsed/Irrigated with Saline Topical Anesthetic Applied: Other: lidocaine 4%, Treatment Notes Wound #4 (Right, Proximal, Midline Lower Leg) 1. Cleansed with: Clean wound with Normal Saline 2. Anesthetic Topical Lidocaine 4% cream to wound bed prior to debridement 4. Dressing Applied: Aquacel Ag 5. Secondary Dressing Applied ABD Pad 7. Secured with 2 Layer Lite Compression System - Bilateral Electronic Signature(s) Signed: 05/05/2015 4:19:08 PM By: Alric Quan Entered By: Alric Quan on 05/03/2015 10:13:41 Jameel, Quant Keimon W. ([id number]) -------------------------------------------------------------------------------- Vitals Details Haji, Ahmar 05/03/2015 9:30 Patient Name: Date of Service: W. AM Medical Record Patient Account Number: [id number] Number: Treating RN: Ahmed Prima Date of Birth/Sex: February 07, 1918 (81 y.o. Male) Other Clinician: Primary Care Physician: [name] Treating Britto, Errol Referring Physician: [name] Physician/Extender: Weeks in Treatment: 0 Vital Signs Time Taken: 09:45 Temperature (F): 97.5 Height (in): 70 Pulse (bpm): 59 Source: Stated Respiratory Rate (breaths/min): 18 Weight (lbs): 180 Blood Pressure (mmHg): 138/55 Source: Stated Reference Range: 80 - 120 mg / dl Body Mass Index (BMI):  25.8 Electronic Signature(s) Signed: 05/05/2015 4:19:08  PM By: Alric Quan Entered By: Alric Quan on 05/03/2015 09:48:00

## 2015-05-13 ENCOUNTER — Encounter (HOSPITAL_BASED_OUTPATIENT_CLINIC_OR_DEPARTMENT_OTHER): Payer: Medicare Other | Admitting: General Surgery

## 2015-05-13 ENCOUNTER — Encounter: Payer: Self-pay | Admitting: General Surgery

## 2015-05-13 DIAGNOSIS — I87313 Chronic venous hypertension (idiopathic) with ulcer of bilateral lower extremity: Secondary | ICD-10-CM | POA: Diagnosis not present

## 2015-05-13 DIAGNOSIS — L97919 Non-pressure chronic ulcer of unspecified part of right lower leg with unspecified severity: Principal | ICD-10-CM

## 2015-05-13 DIAGNOSIS — L97929 Non-pressure chronic ulcer of unspecified part of left lower leg with unspecified severity: Principal | ICD-10-CM

## 2015-05-13 DIAGNOSIS — C44712 Basal cell carcinoma of skin of right lower limb, including hip: Secondary | ICD-10-CM | POA: Diagnosis not present

## 2015-05-13 NOTE — Progress Notes (Signed)
seeiheal 

## 2015-05-13 NOTE — Progress Notes (Addendum)
[name], [name] ([id number]) Visit Report for 05/13/2015 Chief Complaint Document Details [name], [name] 05/13/2015 12:45 Patient Name: Date of Service: W. PM Medical Record Patient Account Number: [id number] Number: Treating RN: Ahmed Prima Date of Birth/Sex: October 16, 1917 (81 y.o. Male) Other Clinician: Primary Care Treating Roxy Manns, [place] Physician: Physician/Extender: Referring Physician: Dede Query in Treatment: 1 Information Obtained from: Patient Chief Complaint Patient presents to the wound care center for a consult due non healing wound both lower extremities with swelling of the legs which she's had for several months Electronic Signature(s) Signed: 05/13/2015 2:04:28 PM By: Judene Companion MD Previous Signature: 05/13/2015 11:33:51 AM Version By: Judene Companion MD Entered By: Judene Companion on 05/13/2015 14:04:28 [name], [name] ([id number]) -------------------------------------------------------------------------------- HPI Details [name] 05/13/2015 12:45 Patient Name: Date of Service: W. PM Medical Record Patient Account Number: [id number] Number: Treating RN: Ahmed Prima Date of Birth/Sex: Mar 01, 1918 (81 y.o. Male) Other Clinician: Primary Care Treating [place], Oliver Hum, [place] Physician: Physician/Extender: Referring Physician: Dede Query in Treatment: 1 History of Present Illness Location: swelling of both lower extremities with ulceration around the ankle and the left lower extremity Quality: Patient reports experiencing a sharp pain to affected area(s). Severity: Patient states wound are getting worse. Duration: Patient has had the wound for > 3 months prior to seeking treatment at the wound center Timing: Pain in wound is constant (hurts all the time) Context: The wound appeared gradually over time Modifying Factors: Other treatment(s) tried include: compression wraps in  the past and radiation therapy to his basal cell carcinoma Associated Signs and Symptoms: Patient reports having increase discharge. HPI Description: 81 year old gentleman was being treated by the [facility] wound care clinic by Dr. Dellia Nims has not followed up since August 2016. He was diagnosis with basal cell carcinoma of both lower extremities and the left one has been treated with radiation therapy until about 6 weeks ago to the left pretibial region. During this time he developed an ulcer of his left ankle and continues to have a lot of drainage. He is also known to have a basal cell carcinoma of his right lower extremity near the shin. The patient does not want any aggressive surgical treatment or any further investigations and wants palliative care only. His past medical history significant for hypertension, osteoarthritis, varicose veins, venous stasis disease, bladder carcinoma, chronic kidney disease, chronic leg ulcers with biopsy-proven basal cell carcinoma. The patient was recently given a course of clindamycin. Electronic Signature(s) Signed: 05/13/2015 2:04:46 PM By: Judene Companion MD Previous Signature: 05/13/2015 11:35:04 AM Version By: Judene Companion MD Entered By: Judene Companion on 05/13/2015 14:04:45 [name] ([id number]) -------------------------------------------------------------------------------- Physical Exam Details [name], [name] 05/13/2015 12:45 Patient Name: Date of Service: W. PM Medical Record Patient Account Number: [id number] Number: Treating RN: Ahmed Prima Date of Birth/Sex: January 17, 1918 (81 y.o. Male) Other Clinician: Primary Care Treating Roxy Manns, [place] Physician: Physician/Extender: Referring Physician: Dede Query in Treatment: 1 Electronic Signature(s) Signed: 05/13/2015 2:04:55 PM By: Judene Companion MD Entered By: Judene Companion on 05/13/2015 14:04:54 [name]  ([id number]) -------------------------------------------------------------------------------- Physician Orders Details [name], [name] 05/13/2015 12:45 Patient Name: Date of Service: W. PM Medical Record Patient Account Number: [id number] Number: Treating RN: Ahmed Prima Date of Birth/Sex: 03-26-1918 (81 y.o. 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 I73.9 Peripheral vascular disease, unspecified Wound Cleansing Wound #1 Left,Lateral Malleolus o Cleanse wound with mild soap and water Wound #2 Left,Proximal,Midline Lower Leg o Cleanse wound with mild soap and water Wound #3 Left,Distal,Circumferential Lower Leg o Cleanse wound with mild soap and water Wound #4 Right,Proximal,Lateral Lower Leg o Cleanse wound with mild soap and water Wound #6 Right,Lateral Lower Leg o Cleanse wound with mild soap and water Anesthetic Wound #1 Left,Lateral Malleolus o Topical Lidocaine 4% cream applied to wound bed prior to debridement Wound #2 Left,Proximal,Midline Lower Leg o Topical Lidocaine 4% cream applied to wound bed prior to debridement Wound #3 Left,Distal,Circumferential Lower Leg Ayars, Cotton W. ([id number]) o Topical Lidocaine 4% cream applied to wound bed prior to debridement Wound #4 Right,Proximal,Lateral Lower Leg o Topical Lidocaine 4% cream applied to wound bed  prior to debridement Wound #6 Right,Lateral Lower Leg o Topical Lidocaine 4% cream applied to wound bed prior to debridement Primary Wound Dressing Wound #1 Left,Lateral Malleolus o Aquacel Ag Wound #2 Left,Proximal,Midline Lower Leg o Aquacel Ag Wound #3 Left,Distal,Circumferential Lower Leg o Aquacel Ag Wound #4 Right,Proximal,Lateral Lower Leg o Aquacel Ag Wound #6 Right,Lateral Lower Leg o Aquacel Ag Secondary Dressing Wound #1 Left,Lateral Malleolus o ABD pad o Foam - for heels for protection Wound #2 Left,Proximal,Midline Lower Leg o ABD pad o Foam - for heels for protection Wound #3 Left,Distal,Circumferential Lower Leg o ABD pad o Foam - for heels for protection Wound #4 Right,Proximal,Lateral Lower Leg o ABD pad o Foam - for heels for protection Wound #6 Right,Lateral Lower Leg o ABD pad o Foam - for heels for protection Dressing Change Frequency Wound #1 Left,Lateral Malleolus o Three times weekly Needs, Ramiro W. ([id number]) Wound #2 Left,Proximal,Midline Lower Leg o Three times weekly Wound #3 Left,Distal,Circumferential Lower Leg o Three times weekly Wound #4 Right,Proximal,Lateral Lower Leg o Three times weekly Follow-up Appointments Wound #1 Left,Lateral Malleolus o Return Appointment in 1 week. Wound #2 Left,Proximal,Midline Lower Leg o Return Appointment in 1 week. Wound #3 Left,Distal,Circumferential Lower Leg o Return Appointment in 1 week. Wound #4 Right,Proximal,Lateral Lower Leg o Return Appointment in 1 week. Wound #6 Right,Lateral Lower Leg o Return Appointment in 1 week. o Return Appointment in 1 week. Edema Control Wound #1 Left,Lateral Malleolus o 2 Layer Lite Compression System - Bilateral o Elevate legs to the level of the heart and pump ankles as often as possible Wound #2 Left,Proximal,Midline Lower Leg o 2 Layer Lite Compression System - Bilateral o Elevate legs  to the level of the heart and pump ankles as often as possible Wound #3 Left,Distal,Circumferential Lower Leg o 2 Layer Lite Compression System - Bilateral o Elevate legs to the level of the heart and pump ankles as often as possible Wound #4 Right,Proximal,Lateral Lower Leg o 2 Layer Lite Compression System - Bilateral o Elevate legs to the level of the heart and pump ankles as often as possible Wound #6 Right,Lateral Lower Leg o 2 Layer Lite Compression System - Bilateral o Elevate legs to the level of the heart and pump ankles as often as possible [name], [name] W. ([id number]) [place] #1 [place]  Nurse may visit PRN to address patientos wound care needs. o FACE TO FACE ENCOUNTER: MEDICARE and MEDICAID PATIENTS: I certify that this patient is under my care and that I had a face-to-face encounter that meets the physician face-to-face encounter requirements with this patient on this date. The encounter with the patient was in whole or in part for the following MEDICAL CONDITION: (primary reason for [place]) MEDICAL NECESSITY: I certify, that based on my findings, NURSING services are a medically necessary home health service. HOME BOUND STATUS: I certify that my clinical findings support that this patient is homebound (i.e., Due to illness or injury, pt requires aid of supportive devices such as crutches, cane, wheelchairs, walkers, the use of special transportation or the assistance of another person to leave their place of residence. There is a normal inability to leave the home and doing so requires considerable and taxing effort. Other absences are for medical reasons / religious services and are infrequent or of short duration when for other reasons). o If current dressing causes regression in wound condition, may D/C ordered dressing product/s and apply Normal Saline Moist Dressing  daily until next [place] / Other MD appointment. [place] of regression in wound condition at [phone]. o Please direct any NON-WOUND related issues/requests for orders to patient's Primary Care Physician Wound #2 Left,Proximal,Midline Lower Leg o [place] Nurse may visit PRN to address patientos wound care needs. o FACE TO FACE ENCOUNTER: MEDICARE and MEDICAID PATIENTS: I certify that this patient is under my care and that I had a face-to-face encounter that meets the physician face-to-face encounter requirements with this patient on this date. The encounter with the patient was in whole or in part for the following MEDICAL CONDITION: (primary reason for [place]) MEDICAL NECESSITY: I certify, that based on my findings, NURSING services are a medically necessary home health service. HOME BOUND STATUS: I certify that my clinical findings support that this patient is homebound (i.e., Due to illness or injury, pt requires aid of supportive devices such as crutches, cane, wheelchairs, walkers, the use of special transportation or the assistance of another person to leave their place of residence. There is a normal inability to leave the home and doing so requires considerable and taxing effort. Other absences are for medical reasons / religious services and are infrequent or of short duration when for other reasons). o If current dressing causes regression in wound condition, may D/C ordered dressing product/s and apply Normal Saline Moist Dressing daily until next [place] / Other MD appointment. [place] of regression in wound condition at [phone]. o Please direct any NON-WOUND related issues/requests for orders to patient's Primary Care Physician Wound #3 Left,Distal,Circumferential Lower Leg o [place] Visits o Home Health Nurse may visit PRN to  address patientos wound care needs. o FACE TO FACE ENCOUNTER: MEDICARE and MEDICAID PATIENTS: I certify that this patient is under my care and that I had a face-to-face encounter that meets the physician face-to-face encounter requirements with this patient on this date. The encounter with the patient was in whole or in part for the following MEDICAL CONDITION: (primary reason for [place]) [name], [name] ([id number]) MEDICAL NECESSITY: I certify, that based on my findings, NURSING services are a medically necessary home health service. HOME BOUND STATUS: I certify that my clinical findings support that this patient is homebound (i.e., Due to illness or injury, pt requires  aid of supportive devices such as crutches, cane, wheelchairs, walkers, the use of special transportation or the assistance of another person to leave their place of residence. There is a normal inability to leave the home and doing so requires considerable and taxing effort. Other absences are for medical reasons / religious services and are infrequent or of short duration when for other reasons). o If current dressing causes regression in wound condition, may D/C ordered dressing product/s and apply Normal Saline Moist Dressing daily until next [place] / Other MD appointment. [place] of regression in wound condition at [phone]. o Please direct any NON-WOUND related issues/requests for orders to patient's Primary Care Physician Wound #4 Right,Proximal,Lateral Lower Leg o [place] Nurse may visit PRN to address patientos wound care needs. o FACE TO FACE ENCOUNTER: MEDICARE and MEDICAID PATIENTS: I certify that this patient is under my care and that I had a face-to-face encounter that meets the physician face-to-face encounter requirements with this patient on this date. The encounter with the patient was in whole or in part for  the following MEDICAL CONDITION: (primary reason for [place]) MEDICAL NECESSITY: I certify, that based on my findings, NURSING services are a medically necessary home health service. HOME BOUND STATUS: I certify that my clinical findings support that this patient is homebound (i.e., Due to illness or injury, pt requires aid of supportive devices such as crutches, cane, wheelchairs, walkers, the use of special transportation or the assistance of another person to leave their place of residence. There is a normal inability to leave the home and doing so requires considerable and taxing effort. Other absences are for medical reasons / religious services and are infrequent or of short duration when for other reasons). o If current dressing causes regression in wound condition, may D/C ordered dressing product/s and apply Normal Saline Moist Dressing daily until next [place] / Other MD appointment. [place] of regression in wound condition at [phone]. o Please direct any NON-WOUND related issues/requests for orders to patient's Primary Care Physician Wound #6 [place] Nurse may visit PRN to address patientos wound care needs. o FACE TO FACE ENCOUNTER: MEDICARE and MEDICAID PATIENTS: I certify that this patient is under my care and that I had a face-to-face encounter that meets the physician face-to-face encounter requirements with this patient on this date. The encounter with the patient was in whole or in part for the following MEDICAL CONDITION: (primary reason for [place]) MEDICAL NECESSITY: I certify, that based on my findings, NURSING services are a medically necessary home health service. HOME BOUND STATUS: I certify that my clinical findings support that this patient is homebound (i.e., Due to illness or injury, pt requires aid of supportive devices such as crutches,  cane, wheelchairs, walkers, the use of special transportation or the assistance of another person to leave their place of residence. There is a normal inability to leave the home and doing so requires considerable and taxing effort. Other absences are for medical reasons / religious services and are infrequent or of short duration when for other reasons). KRAMER, DEJESUS. ([id number]) o If current dressing causes regression in wound condition, may D/C ordered dressing product/s and apply Normal Saline Moist Dressing daily until next [place] / Other MD appointment. [place] of regression in wound condition at [phone]. o Please direct any NON-WOUND  related issues/requests for orders to patient's Primary Care Physician Electronic Signature(s) Signed: 05/13/2015 4:28:27 PM By: Alric Quan Signed: 05/14/2015 8:01:58 AM By: Judene Companion MD Entered By: Alric Quan on 05/13/2015 14:06:07 [name], [place] ([id number]) -------------------------------------------------------------------------------- Problem List Details JOSEPHMICHAEL, ADONA 05/13/2015 12:45 Patient Name: Date of Service: W. PM Medical Record Patient Account Number: [id number] Number: Treating RN: Ahmed Prima Date of Birth/Sex: 02-Dec-1917 (81 y.o. Male) Other Clinician: Primary Care Treating Roxy Manns, [place] Physician: Physician/Extender: Referring Physician: Dede Query in Treatment: 1 Active Problems ICD-10 Encounter Code Description Active Date Diagnosis I89.0 Lymphedema, not elsewhere classified 05/03/2015 Yes C44.712 Basal cell carcinoma of skin of right lower limb, including 05/03/2015 Yes hip C44.719 Basal cell carcinoma of skin of left lower limb, including 05/03/2015 Yes hip L97.322 Non-pressure chronic ulcer of left ankle with fat layer 05/03/2015 Yes exposed L97.212 Non-pressure chronic ulcer of right calf with fat  layer 05/03/2015 Yes exposed I73.9 Peripheral vascular disease, unspecified 05/03/2015 Yes Inactive Problems Resolved Problems Electronic Signature(s) Signed: 05/13/2015 2:04:15 PM By: Judene Companion MD Previous Signature: 05/13/2015 11:33:09 AM Version By: Judene Companion MD Entered By: Judene Companion on 05/13/2015 14:04:15 [name], [name] ([id number]) [name], [name] W. ([id number]) -------------------------------------------------------------------------------- Progress Note Details [name], [name] 05/13/2015 12:45 Patient Name: Date of Service: W. PM Medical Record Patient Account Number: [id number] Number: Treating RN: Ahmed Prima Date of Birth/Sex: [birth year] (81 y.o. Male) Other Clinician: Primary Care Treating Roxy Manns, [place] Physician: Physician/Extender: Referring Physician: Dede Query in Treatment: 1 Subjective Chief Complaint Information obtained from Patient Patient presents to the wound care center for a consult due non healing wound both lower extremities with swelling of the legs which she's had for several months History of Present Illness (HPI) The following HPI elements were documented for the patient's wound: Location: swelling of both lower extremities with ulceration around the ankle and the left lower extremity Quality: Patient reports experiencing a sharp pain to affected area(s). Severity: Patient states wound are getting worse. Duration: Patient has had the wound for > 3 months prior to seeking treatment at the wound center Timing: Pain in wound is constant (hurts all the time) Context: The wound appeared gradually over time Modifying Factors: Other treatment(s) tried include: compression wraps in the past and radiation therapy to his basal cell carcinoma Associated Signs and Symptoms: Patient reports having increase discharge. 80 year old gentleman was being treated by the [facility] wound care clinic by Dr.  Dellia Nims has not followed up since August 2016. He was diagnosis with basal cell carcinoma of both lower extremities and the left one has been treated with radiation therapy until about 6 weeks ago to the left pretibial region. During this time he developed an ulcer of his left ankle and continues to have a lot of drainage. He is also known to have a basal cell carcinoma of his right lower extremity near the shin. The patient does not want any aggressive surgical treatment or any further investigations and wants palliative care only. His past medical history significant for hypertension, osteoarthritis, varicose veins, venous stasis disease, bladder carcinoma, chronic kidney disease, chronic leg ulcers with biopsy-proven basal cell carcinoma. The patient was recently given a course of clindamycin. Objective Constitutional CAPRI, SADLIER. ([id number]) Vitals Time Taken: 12:55 PM, Height: 70 in, Weight: 180 lbs, BMI: 25.8, Temperature: 98.0 F, Pulse: 52 bpm, Respiratory Rate: 18 breaths/min, Blood Pressure: 147/58 mmHg. Integumentary (Hair, Skin) Wound #1 status is Open. Original cause of wound was Gradually Appeared. The wound is  located on the Left,Lateral Malleolus. The wound measures 5cm length x 3cm width x 0.3cm depth; 11.781cm^2 area and 3.534cm^3 volume. The wound is limited to skin breakdown. There is no tunneling or undermining noted. There is a large amount of serous drainage noted. The wound margin is epibole. There is no granulation within the wound bed. There is a large (67-100%) amount of necrotic tissue within the wound bed including Adherent Slough. The periwound skin appearance exhibited: Localized Edema, Maceration, Moist. Wound #2 status is Open. Original cause of wound was Radiation Burn. The wound is located on the Left,Proximal,Midline Lower Leg. The wound measures 1cm length x 1cm width x 0.1cm depth; 0.785cm^2 area and 0.079cm^3 volume. The wound is limited to skin  breakdown. There is no tunneling or undermining noted. There is a large amount of serous drainage noted. There is large (67-100%) red, pink granulation within the wound bed. There is a small (1-33%) amount of necrotic tissue within the wound bed including Adherent Slough. The periwound skin appearance exhibited: Localized Edema, Maceration, Moist. Periwound temperature was noted as No Abnormality. The periwound has tenderness on palpation. Wound #3 status is Open. Original cause of wound was Radiation Burn. The wound is located on the Left,Distal,Circumferential Lower Leg. The wound measures 16.4cm length x 25cm width x 0.1cm depth; 322.013cm^2 area and 32.201cm^3 volume. There is no tunneling or undermining noted. There is a large amount of serosanguineous drainage noted. The wound margin is flat and intact. There is small (1-33%) pink granulation within the wound bed. There is a large (67-100%) amount of necrotic tissue within the wound bed including Eschar and Adherent Slough. The periwound skin appearance exhibited: Localized Edema, Dry/Scaly, Maceration, Moist. Periwound temperature was noted as No Abnormality. The periwound has tenderness on palpation. Wound #4 status is Open. Original cause of wound was Blister. The wound is located on the Right,Proximal,Lateral Lower Leg. The wound measures 2cm length x 0.5cm width x 0.1cm depth; 0.785cm^2 area and 0.079cm^3 volume. There is a medium amount of serous drainage noted. The wound margin is flat and intact. There is no granulation within the wound bed. There is a large (67-100%) amount of necrotic tissue within the wound bed including Adherent Slough. The periwound skin appearance exhibited: Localized Edema, Moist. Periwound temperature was noted as No Abnormality. The periwound has tenderness on palpation. Wound #6 status is Open. Original cause of wound was Gradually Appeared. The wound is located on the Right,Lateral Lower Leg. The wound  measures 2.5cm length x 2.7cm width x 0.1cm depth; 5.301cm^2 area and 0.53cm^3 volume. The wound is limited to skin breakdown. There is no tunneling or undermining noted. There is a large amount of serous drainage noted. The wound margin is flat and intact. There is no granulation within the wound bed. There is a large (67-100%) amount of necrotic tissue within the wound bed including Adherent Slough. The periwound skin appearance exhibited: Localized Edema, Moist. Assessment [name], [name] W. ([id number]) Active Problems ICD-10 I89.0 - Lymphedema, not elsewhere classified C44.712 - Basal cell carcinoma of skin of right lower limb, including hip C44.719 - Basal cell carcinoma of skin of left lower limb, including hip L97.322 - Non-pressure chronic ulcer of left ankle with fat layer exposed L97.212 - Non-pressure chronic ulcer of right calf with fat layer exposed I73.9 - Peripheral vascular disease, unspecified Plan Wound Cleansing: Wound #1 Left,Lateral Malleolus: Cleanse wound with mild soap and water Wound #2 Left,Proximal,Midline Lower Leg: Cleanse wound with mild soap and water Wound #3 Left,Distal,Circumferential Lower  Leg: Cleanse wound with mild soap and water Wound #4 Right,Proximal,Lateral Lower Leg: Cleanse wound with mild soap and water Wound #6 Right,Lateral Lower Leg: Cleanse wound with mild soap and water Anesthetic: Wound #1 Left,Lateral Malleolus: Topical Lidocaine 4% cream applied to wound bed prior to debridement Wound #6 Right,Lateral Lower Leg: Topical Lidocaine 4% cream applied to wound bed prior to debridement Wound #2 Left,Proximal,Midline Lower Leg: Topical Lidocaine 4% cream applied to wound bed prior to debridement Wound #3 Left,Distal,Circumferential Lower Leg: Topical Lidocaine 4% cream applied to wound bed prior to debridement Wound #4 Right,Proximal,Lateral Lower Leg: Topical Lidocaine 4% cream applied to wound bed prior to debridement Primary  Wound Dressing: Wound #1 Left,Lateral Malleolus: Aquacel Ag Wound #6 Right,Lateral Lower Leg: Aquacel Ag Wound #2 Left,Proximal,Midline Lower Leg: Aquacel Ag Wound #3 Left,Distal,Circumferential Lower Leg: Aquacel Ag Wound #4 Right,Proximal,Lateral Lower Leg: Aquacel Ag [name] ([id number]) Secondary Dressing: Wound #1 Left,Lateral Malleolus: ABD pad Wound #2 Left,Proximal,Midline Lower Leg: ABD pad Wound #3 Left,Distal,Circumferential Lower Leg: ABD pad Wound #4 Right,Proximal,Lateral Lower Leg: ABD pad Wound #6 Right,Lateral Lower Leg: ABD pad Dressing Change Frequency: Wound #1 Left,Lateral Malleolus: Three times weekly Wound #2 Left,Proximal,Midline Lower Leg: Three times weekly Wound #3 Left,Distal,Circumferential Lower Leg: Three times weekly Wound #4 Right,Proximal,Lateral Lower Leg: Three times weekly Follow-up Appointments: Wound #1 Left,Lateral Malleolus: Return Appointment in 1 week. Wound #6 Right,Lateral Lower Leg: Return Appointment in 1 week. Return Appointment in 1 week. Wound #2 Left,Proximal,Midline Lower Leg: Return Appointment in 1 week. Wound #3 Left,Distal,Circumferential Lower Leg: Return Appointment in 1 week. Wound #4 Right,Proximal,Lateral Lower Leg: Return Appointment in 1 week. Edema Control: Wound #1 Left,Lateral Malleolus: 2 Layer Lite Compression System - Bilateral Elevate legs to the level of the heart and pump ankles as often as possible Wound #6 Right,Lateral Lower Leg: 2 Layer Lite Compression System - Bilateral Elevate legs to the level of the heart and pump ankles as often as possible Wound #2 Left,Proximal,Midline Lower Leg: 2 Layer Lite Compression System - Bilateral Elevate legs to the level of the heart and pump ankles as often as possible Wound #3 Left,Distal,Circumferential Lower Leg: 2 Layer Lite Compression System - Bilateral Elevate legs to the level of the heart and pump ankles as often as  possible Wound #4 Right,Proximal,Lateral Lower Leg: 2 Layer Lite Compression System - Bilateral Elevate legs to the level of the heart and pump ankles as often as possible Home Health: Wound #1 Left,Lateral Malleolus: [employer] Visits [name], [name] ([id number]) [place] Nurse may visit PRN to address patient s wound care needs. FACE TO FACE ENCOUNTER: MEDICARE and MEDICAID PATIENTS: I certify that this patient is under my care and that I had a face-to-face encounter that meets the physician face-to-face encounter requirements with this patient on this date. The encounter with the patient was in whole or in part for the following MEDICAL CONDITION: (primary reason for [place]) MEDICAL NECESSITY: I certify, that based on my findings, NURSING services are a medically necessary home health service. HOME BOUND STATUS: I certify that my clinical findings support that this patient is homebound (i.e., Due to illness or injury, pt requires aid of supportive devices such as crutches, cane, wheelchairs, walkers, the use of special transportation or the assistance of another person to leave their place of residence. There is a normal inability to leave the home and doing so requires considerable and taxing effort. Other absences are for medical reasons / religious services and are infrequent or  of short duration when for other reasons). If current dressing causes regression in wound condition, may D/C ordered dressing product/s and apply Normal Saline Moist Dressing daily until next [place] / Other MD appointment. [place] of regression in wound condition at [phone]. Please direct any NON-WOUND related issues/requests for orders to patient's Primary Care Physician Wound #6 Right,Lateral Lower Leg: [place] Nurse may visit PRN to address patient s wound care needs. FACE TO FACE ENCOUNTER: MEDICARE and  MEDICAID PATIENTS: I certify that this patient is under my care and that I had a face-to-face encounter that meets the physician face-to-face encounter requirements with this patient on this date. The encounter with the patient was in whole or in part for the following MEDICAL CONDITION: (primary reason for [place]) MEDICAL NECESSITY: I certify, that based on my findings, NURSING services are a medically necessary home health service. HOME BOUND STATUS: I certify that my clinical findings support that this patient is homebound (i.e., Due to illness or injury, pt requires aid of supportive devices such as crutches, cane, wheelchairs, walkers, the use of special transportation or the assistance of another person to leave their place of residence. There is a normal inability to leave the home and doing so requires considerable and taxing effort. Other absences are for medical reasons / religious services and are infrequent or of short duration when for other reasons). If current dressing causes regression in wound condition, may D/C ordered dressing product/s and apply Normal Saline Moist Dressing daily until next [place] / Other MD appointment. [place] of regression in wound condition at [phone]. Please direct any NON-WOUND related issues/requests for orders to patient's Primary Care Physician Wound #2 Left,Proximal,Midline Lower Leg: [place] Nurse may visit PRN to address patient s wound care needs. FACE TO FACE ENCOUNTER: MEDICARE and MEDICAID PATIENTS: I certify that this patient is under my care and that I had a face-to-face encounter that meets the physician face-to-face encounter requirements with this patient on this date. The encounter with the patient was in whole or in part for the following MEDICAL CONDITION: (primary reason for [place]) MEDICAL NECESSITY: I certify, that based on my findings, NURSING  services are a medically necessary home health service. HOME BOUND STATUS: I certify that my clinical findings support that this patient is homebound (i.e., Due to illness or injury, pt requires aid of supportive devices such as crutches, cane, wheelchairs, walkers, the use of special transportation or the assistance of another person to leave their place of residence. There is a normal inability to leave the home and doing so requires considerable and taxing effort. Other absences are for medical reasons / religious services and are infrequent or of short duration when for other reasons). If current dressing causes regression in wound condition, may D/C ordered dressing product/s and apply Normal Saline Moist Dressing daily until next [place] / Other MD appointment. [place] of regression in wound condition at [phone]. Please direct any NON-WOUND related issues/requests for orders to patient's Primary Care Physician Wound #3 Left,Distal,Circumferential Lower Leg: [place] Visits DAX, VOLD ([id number]) [place] Nurse may visit PRN to address patient s wound care needs. FACE TO FACE ENCOUNTER: MEDICARE and MEDICAID PATIENTS: I certify that this patient is under my care and that I had a face-to-face encounter that meets the physician face-to-face encounter requirements with this patient on this  date. The encounter with the patient was in whole or in part for the following MEDICAL CONDITION: (primary reason for [place]) MEDICAL NECESSITY: I certify, that based on my findings, NURSING services are a medically necessary home health service. HOME BOUND STATUS: I certify that my clinical findings support that this patient is homebound (i.e., Due to illness or injury, pt requires aid of supportive devices such as crutches, cane, wheelchairs, walkers, the use of special transportation or the assistance of another person to leave their  place of residence. There is a normal inability to leave the home and doing so requires considerable and taxing effort. Other absences are for medical reasons / religious services and are infrequent or of short duration when for other reasons). If current dressing causes regression in wound condition, may D/C ordered dressing product/s and apply Normal Saline Moist Dressing daily until next [place] / Other MD appointment. [place] of regression in wound condition at [phone]. Please direct any NON-WOUND related issues/requests for orders to patient's Primary Care Physician Wound #4 Right,Proximal,Lateral Lower Leg: [place] Nurse may visit PRN to address patient s wound care needs. FACE TO FACE ENCOUNTER: MEDICARE and MEDICAID PATIENTS: I certify that this patient is under my care and that I had a face-to-face encounter that meets the physician face-to-face encounter requirements with this patient on this date. The encounter with the patient was in whole or in part for the following MEDICAL CONDITION: (primary reason for [place]) MEDICAL NECESSITY: I certify, that based on my findings, NURSING services are a medically necessary home health service. HOME BOUND STATUS: I certify that my clinical findings support that this patient is homebound (i.e., Due to illness or injury, pt requires aid of supportive devices such as crutches, cane, wheelchairs, walkers, the use of special transportation or the assistance of another person to leave their place of residence. There is a normal inability to leave the home and doing so requires considerable and taxing effort. Other absences are for medical reasons / religious services and are infrequent or of short duration when for other reasons). If current dressing causes regression in wound condition, may D/C ordered dressing product/s and apply Normal Saline Moist Dressing daily until  next [place] / Other MD appointment. [place] of regression in wound condition at [phone]. Please direct any NON-WOUND related issues/requests for orders to patient's Primary Care Physician Will Rx venous non-healing wounds with santyl and compression Electronic Signature(s) Signed: 05/13/2015 2:06:25 PM By: Judene Companion MD Entered By: Judene Companion on 05/13/2015 14:06:25 [name] ([id number]) -------------------------------------------------------------------------------- Annabell Howells, Nima Date of Service: 05/13/2015 Patient Name: W. Patient Account Number: [id number] Medical Record Treating RN: Ahmed Prima [id number] Number: Other Clinician: Date of Birth/Sex: [birth year] (81 y.o. 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 I73.9 Peripheral vascular disease, unspecified Facility Procedures CPT4 Code: [id number] Description: [id number] - WOUND CARE VISIT-LEV 5 EST PT Modifier: Quantity: 1 Physician Procedures CPT4 Code: [id number] Description: [id number] - WC PHYS LEVEL 2 - EST PT ICD-10 Description Diagnosis L97.212 Non-pressure chronic  ulcer of right calf with fat Modifier: layer exposed Quantity: 1 Electronic Signature(s) Signed: 05/13/2015 4:28:27 PM By: Alric Quan Signed: 05/14/2015 8:01:58 AM By: Judene Companion MD Previous Signature: 05/13/2015 2:07:07 PM Version By: Judene Companion MD Entered By: Alric Quan on 05/13/2015 14:08:10

## 2015-05-13 NOTE — Progress Notes (Addendum)
[name], [name] ([id number]) Visit Report for 05/13/2015 Arrival Information Details [name], [name] 05/13/2015 12:45 Patient Name: Date of Service: W. PM Medical Record Patient Account Number: [id number] Number: Treating RN: Ahmed Prima Date of Birth/Sex: 1917-10-06 (81 y.o. Male) Other Clinician: Primary Care Treating Roxy Manns, [place] Physician: Physician/Extender: Referring Physician: Dede Query in Treatment: 1 Visit Information History Since Last Visit All ordered tests and consults were completed: No Patient Arrived: Wheel Chair Added or deleted any medications: No Arrival Time: 12:53 Any new allergies or adverse reactions: No Accompanied By: daughter Had a fall or experienced change in No Transfer Assistance: Other activities of daily living that may affect Patient Identification Verified: Yes risk of falls: Secondary Verification Process Yes Signs or symptoms of abuse/neglect since last No Completed: visito Patient Requires Transmission- No Hospitalized since last visit: No Based Precautions: Pain Present Now: No Patient Has Alerts: Yes Patient Alerts: Patient on Blood Thinner plavix Electronic Signature(s) Signed: 05/13/2015 4:28:27 PM By: Alric Quan Entered By: Alric Quan on 05/13/2015 12:55:26 [place], [place]. ([id number]) -------------------------------------------------------------------------------- Clinic Level of Care Assessment Details [name], [name] 05/13/2015 12:45 Patient Name: Date of Service: W. PM Medical Record Patient Account Number: [id number] Number: Treating RN: Ahmed Prima Date of Birth/Sex: [birth year] (81 y.o. Male) Other Clinician: Primary Care Treating Roxy Manns, [place] Physician: Physician/Extender: Referring Physician: Dede Query in Treatment: 1 Clinic Level of Care Assessment Items TOOL 4 Quantity Score X - Use when only an EandM  is performed on FOLLOW-UP visit 1 0 ASSESSMENTS - Nursing Assessment / Reassessment _0  - Reassessment of Co-morbidities (includes updates in patient status) 0 _1  - Reassessment of Adherence to Treatment Plan 0 ASSESSMENTS - Wound and Skin Assessment / Reassessment _2  - Simple Wound Assessment / Reassessment - one wound 0 X - Complex Wound Assessment / Reassessment - multiple wounds 5 5 _3  - Dermatologic / Skin Assessment (not related to wound area) 0 ASSESSMENTS - Focused Assessment X - Circumferential Edema Measurements - multi extremities 1 5 _4  - Nutritional Assessment / Counseling / Intervention 0 _5  - Lower Extremity Assessment (monofilament, tuning fork, pulses) 0 _6  - Peripheral Arterial Disease Assessment (using hand held doppler) 0 ASSESSMENTS - Ostomy and/or Continence Assessment and Care _7  - Incontinence Assessment and Management 0 _8  - Ostomy Care Assessment and Management (repouching, etc.) 0 PROCESS - Coordination of Care _9  - Simple Patient / Family Education for ongoing care 0 X - Complex (extensive) Patient / Family Education for ongoing care 1 20 _10  - Staff obtains Consents, Records, Test Results / Process Orders 0 [place], [name] ([id number]) _11  - Staff telephones HHA, Nursing Homes / Clarify orders / etc 0 _12  - Routine Transfer to another Facility (non-emergent condition) 0 _13  - Routine Hospital Admission (non-emergent condition) 0 _14  - New Admissions / Biomedical engineer / Ordering NPWT, Apligraf, etc. 0 _15  - Emergency Hospital Admission (emergent condition) 0 X - Simple Discharge Coordination 1 10 _16  - Complex (extensive) Discharge Coordination 0 PROCESS - Special Needs _17  - Pediatric / Minor Patient Management 0 _18  - Isolation Patient Management 0 _19  - Hearing / Language / Visual special needs 0 _20  - Assessment of Community assistance (transportation, D/C planning, etc.) 0 _21  - Additional assistance / Altered mentation 0 _22  - Support Surface(s)  Assessment (bed, cushion, seat, etc.) 0 INTERVENTIONS - Wound Cleansing / Measurement _23  - Simple Wound Cleansing - one wound 0 X - Complex Wound Cleansing - multiple wounds 5 5 X - Wound Imaging (photographs -  any number of wounds) 1 5 _0  - Wound Tracing (instead of photographs) 0 _1  - Simple Wound Measurement - one wound 0 X - Complex Wound Measurement - multiple wounds 5 5 INTERVENTIONS - Wound Dressings X - Small Wound Dressing one or multiple wounds 4 10 X - Medium Wound Dressing one or multiple wounds 1 15 _2  - Large Wound Dressing one or multiple wounds 0 X - Application of Medications - topical 1 5 <[id number]>_6<\/[id number]>_0  - Application of Medications - injection 0 [name], [name] W. ([id number]) INTERVENTIONS - Miscellaneous _4  - External ear exam 0 _5  - Specimen Collection (cultures, biopsies, blood, body fluids, etc.) 0 _6  - Specimen(s) / Culture(s) sent or taken to Lab for analysis 0 _7  - Patient Transfer (multiple staff / Harrel Lemon Lift / Similar devices) 0 _8  - Simple Staple / Suture removal (25 or less) 0 _9  - Complex Staple / Suture removal (26 or more) 0 _10  - Hypo / Hyperglycemic Management (close monitor of Blood Glucose) 0 _11  - Ankle / Brachial Index (ABI) - do not check if billed separately 0 X - Vital Signs 1 5 Has the patient been seen at the hospital within the last three years: Yes Total Score: 180 Level Of Care: New/Established - Level 5 Electronic Signature(s) Signed: 05/13/2015 4:28:27 PM By: Alric Quan Entered By: Alric Quan on 05/13/2015 14:07:57 [name], [name] W. ([id number]) -------------------------------------------------------------------------------- Encounter Discharge Information Details [name], [name] 05/13/2015 12:45 Patient Name: Date of Service: W. PM Medical Record Patient Account Number: [id number] Number: Treating RN: Ahmed Prima Date of Birth/Sex: 06-21-1917 (81 y.o. Male) Other Clinician: Primary Care Treating Roxy Manns, [place] Physician: Physician/Extender: Referring Physician: Dede Query in Treatment: 1 Encounter Discharge Information Items Discharge Pain Level: 0 Discharge Condition: Stable Ambulatory Status: Wheelchair Discharge Destination: Home Transportation: Private Auto Accompanied By: daughter Schedule Follow-up Appointment: Yes Medication Reconciliation completed and provided to Patient/Care Yes Sevyn Paredez: Provided on Clinical Summary of Care: 05/13/2015 Form Type Recipient Paper Patient [facility] Electronic Signature(s) Signed: 05/13/2015 2:13:09 PM By: Sharon Mt Previous Signature: 05/13/2015 2:07:39 PM Version By: Judene Companion MD Entered By: Sharon Mt on 05/13/2015 14:13:09 [name] ([id number]) -------------------------------------------------------------------------------- Lower Extremity Assessment Details [name], [name] 05/13/2015 12:45 Patient Name: Date of Service: W. PM Medical Record Patient Account Number: [id number] Number: Treating RN: Ahmed Prima Date of Birth/Sex: [birth year] (81 y.o. Male) Other Clinician: Primary Care Treating Roxy Manns, [place] Physician: Physician/Extender: Referring Physician: Dede Query in Treatment: 1 Edema Assessment Assessed: [Left: No] [Right: No] Edema: [Left: Yes] [Right: Yes] Calf Left: Right: Point of Measurement: cm From Medial Instep 40 cm 35 cm Ankle Left: Right: Point of Measurement: cm From Medial Instep 28.5 cm 27.5 cm Vascular Assessment Pulses: Posterior Tibial Dorsalis Pedis Palpable: [Left:No] [Right:No] Doppler: [Left:Monophasic] [Right:Monophasic] Extremity colors, hair growth, and conditions: Extremity Color: [Left:Hyperpigmented] [Right:Hyperpigmented] Hair Growth on Extremity: [Left:No] [Right:No] Temperature of Extremity: [Left:Warm] [Right:Warm] Capillary Refill: [Left:< 3 seconds] [Right:< 3 seconds] Toe Nail Assessment Left:  Right: Thick: Yes Yes Discolored: Yes Yes Deformed: Yes Yes Improper Length and Hygiene: Yes Yes Electronic Signature(s) Signed: 05/13/2015 4:28:27 PM By: Hilaria Ota, Tykeem W. ([id number]) Entered By: Alric Quan on 05/13/2015 13:08:13 [name] ([id number]) -------------------------------------------------------------------------------- Multi Wound Chart Details [name], [name] 05/13/2015 12:45 Patient Name: Date of Service: W. PM Medical Record Patient Account Number: [id number] Number: Treating RN: Ahmed Prima Date of Birth/Sex: [birth year] (81 y.o. Male) Other Clinician: Primary Care Treating Roxy Manns, [place] Physician: Physician/Extender: Referring Physician: [name]  Weeks in Treatment: 1 Vital Signs Height(in): 70 Pulse(bpm): 52 Weight(lbs): 180 Blood Pressure 147/58 (mmHg): Body Mass Index(BMI): 26 Temperature(F): 98.0 Respiratory Rate 18 (breaths/min): Photos: [1:No Photos] [2:No Photos] [3:No Photos] Wound Location: [1:Left Malleolus - Lateral] [2:Left Lower Leg - Midline, Proximal] [3:Left Lower Leg - Distal, Circumfernential] Wounding Event: [1:Gradually Appeared] [2:Radiation Burn] [3:Radiation Burn] Primary Etiology: [1:To be determined] [2:Trauma, Other] [3:Trauma, Other] Comorbid History: [1:Glaucoma, Hypertension, Glaucoma, Hypertension, Osteoarthritis] [2:Osteoarthritis] [3:Glaucoma, Hypertension, Osteoarthritis] Date Acquired: [1:04/12/2015] [2:05/02/2014] [3:05/02/2014] Weeks of Treatment: [1:1] [2:1] [3:1] Wound Status: [1:Open] [2:Open] [3:Open] Measurements L x W x D 5x3x0.3 [2:1x1x0.1] [3:16.4x25x0.1] (cm) Area (cm) : [1:11.781] [2:0.785] [3:322.013] Volume (cm) : [1:3.534] [2:0.079] [3:32.201] % Reduction in Area: [1:14.30%] [2:16.70%] [3:14.00%] % Reduction in Volume: 14.30% [2:16.00%] [3:14.00%] Classification: [1:Partial Thickness] [2:Partial Thickness] [3:Partial  Thickness] Exudate Amount: [1:Large] [2:Large] [3:Large] Exudate Type: [1:Serous] [2:Serous] [3:Serosanguineous] Exudate Color: [1:amber] [2:amber] [3:red, brown] Foul Odor After [1:Yes] [2:Yes] [3:Yes] Cleansing: Odor Anticipated Due to No [2:No] [3:No] Product Use: Wound Margin: [1:Epibole] [2:N/A] [3:Flat and Intact] Granulation Amount: [1:None Present (0%)] [2:Large (67-100%)] [3:Small (1-33%)] Granulation Quality: [1:N/A] [2:Red, Pink] [3:Pink] Necrotic Amount: Large (67-100%) Small (1-33%) Large (67-100%) Necrotic Tissue: [place], Adherent Slough Exposed Structures: Fascia: No Fascia: No N/A Fat: No Fat: No Tendon: No Tendon: No Muscle: No Muscle: No Joint: No Joint: No Bone: No Bone: No Limited to Skin Limited to Skin Breakdown Breakdown Epithelialization: None None None Periwound Skin Texture: Edema: Yes Edema: Yes Edema: Yes Periwound Skin Maceration: Yes Maceration: Yes Maceration: Yes Moisture: Moist: Yes Moist: Yes Moist: Yes Dry/Scaly: Yes Periwound Skin Color: No Abnormalities Noted No Abnormalities Noted No Abnormalities Noted Temperature: N/A No Abnormality No Abnormality Tenderness on No Yes Yes Palpation: Wound Preparation: Ulcer Cleansing: Ulcer Cleansing: Ulcer Cleansing: Rinsed/Irrigated with Rinsed/Irrigated with Rinsed/Irrigated with Saline Saline Saline Topical Anesthetic Topical Anesthetic Topical Anesthetic Applied: Other: lidocaine Applied: Other: lidocaine Applied: Other: lidocaine 4% 4% 4% Wound Number: 4 6 N/A Photos: No Photos No Photos N/A Wound Location: Right, Proximal, Lateral Right, Lateral Lower Leg N/A Lower Leg Wounding Event: Blister Gradually Appeared N/A Primary Etiology: To be determined Venous Leg Ulcer N/A Comorbid History: Glaucoma, Hypertension, Glaucoma, Hypertension, N/A Osteoarthritis Osteoarthritis Date Acquired: 05/02/2014 05/13/2015 N/A Weeks of Treatment: 1 0 N/A Wound  Status: Open Open N/A Measurements L x W x D 2x0.5x0.1 2.5x2.7x0.1 N/A (cm) Area (cm) : 0.785 5.301 N/A Volume (cm) : 0.079 0.53 N/A % Reduction in Area: 0.00% N/A N/A % Reduction in Volume: 0.00% N/A N/A Classification: Partial Thickness Partial Thickness N/A Exudate Amount: Medium Large N/A Exudate Type: Serous Serous N/A Exudate Color: amber amber N/A No No N/A [name], [place]. ([id number]) Foul Odor After Cleansing: Odor Anticipated Due to N/A N/A N/A Product Use: Wound Margin: Flat and Intact Flat and Intact N/A Granulation Amount: None Present (0%) None Present (0%) N/A Granulation Quality: N/A N/A N/A Necrotic Amount: Large (67-100%) Large (67-100%) N/A Necrotic Tissue: Adherent Slough Adherent Slough N/A Exposed Structures: N/A Fascia: No N/A Fat: No Tendon: No Muscle: No Joint: No Bone: No Limited to Skin Breakdown Epithelialization: None None N/A Periwound Skin Texture: Edema: Yes Edema: Yes N/A Periwound Skin Moist: Yes Moist: Yes N/A Moisture: Periwound Skin Color: No Abnormalities Noted No Abnormalities Noted N/A Temperature: No Abnormality N/A N/A Tenderness on Yes No N/A Palpation: Wound Preparation: Ulcer Cleansing: Ulcer Cleansing: Other: N/A Rinsed/Irrigated with soap and water Saline Topical Anesthetic Topical Anesthetic Applied: Other: lidocaine Applied: Other: lidocaine 4% 4% Treatment  Notes Electronic Signature(s) Signed: 05/13/2015 4:28:27 PM By: Alric Quan Entered By: Alric Quan on 05/13/2015 13:44:38 [name], [name] W. ([id number]) -------------------------------------------------------------------------------- Multi-Disciplinary Care Plan Details [name], [name] 05/13/2015 12:45 Patient Name: Date of Service: W. PM Medical Record Patient Account Number: [id number] Number: Treating RN: Ahmed Prima Date of Birth/Sex: [birth year] (81 y.o. Male) Other Clinician: Primary Care Treating Roxy Manns, [place] Physician: Physician/Extender: Referring Physician: Dede Query in Treatment: 1 Active Inactive Abuse / Safety / Falls / Self Care Management Nursing Diagnoses: Impaired physical mobility Potential for falls Goals: Patient will remain injury free Date Initiated: 05/03/2015 Goal Status: Active Patient/caregiver will verbalize understanding of skin care regimen Date Initiated: 05/03/2015 Goal Status: Active Interventions: Assess fall risk on admission and as needed Assess: immobility, friction, shearing, incontinence upon admission and as needed Notes: Nutrition Nursing Diagnoses: Imbalanced nutrition Goals: Patient/caregiver agrees to and verbalizes understanding of need to obtain nutritional consultation Date Initiated: 05/03/2015 Goal Status: Active Interventions: Provide education on nutrition Notes: [name], [name] ([id number]) Orientation to the Wound Care Program Nursing Diagnoses: Knowledge deficit related to the wound healing center program Goals: Patient/caregiver will verbalize understanding of the [facility] Date Initiated: 05/03/2015 Goal Status: Active Interventions: Provide education on orientation to the wound center Notes: Pain, Acute or Chronic Nursing Diagnoses: Pain Management - Cyclic Acute (Dressing Change Related) Pain Management - Non-cyclic Acute (Procedural) Goals: Patient will verbalize adequate pain control and receive pain control interventions during procedures as needed Date Initiated: 05/03/2015 Goal Status: Active Patient/caregiver will verbalize comfort level met Date Initiated: 05/03/2015 Goal Status: Active Interventions: Assess comfort goal upon admission Provide education on pain management Notes: Wound/Skin Impairment Nursing Diagnoses: Impaired tissue integrity Knowledge deficit related to ulceration/compromised skin integrity Goals: Patient/caregiver will verbalize  understanding of skin care regimen Date Initiated: 05/03/2015 Goal Status: Active Ulcer/skin breakdown will have a volume reduction of 30% by week 4 [name], [name] ([id number]) Date Initiated: 05/03/2015 Goal Status: Active Ulcer/skin breakdown will have a volume reduction of 50% by week 8 Date Initiated: 05/03/2015 Goal Status: Active Ulcer/skin breakdown will have a volume reduction of 80% by week 12 Date Initiated: 05/03/2015 Goal Status: Active Interventions: Assess patient/caregiver ability to obtain necessary supplies Assess patient/caregiver ability to perform ulcer/skin care regimen upon admission and as needed Notes: Electronic Signature(s) Signed: 05/13/2015 4:28:27 PM By: Alric Quan Entered By: Alric Quan on 05/13/2015 13:44:30 [place], Jovahn W. ([id number]) -------------------------------------------------------------------------------- Pain Assessment Details [name], [name] 05/13/2015 12:45 Patient Name: Date of Service: W. PM Medical Record Patient Account Number: [id number] Number: Treating RN: Ahmed Prima Date of Birth/Sex: 1917-08-06 (81 y.o. Male) Other Clinician: Primary Care Treating Roxy Manns, [place] Physician: Physician/Extender: Referring Physician: Dede Query in Treatment: 1 Active Problems Location of Pain Severity and Description of Pain Patient Has Paino No Site Locations Pain Management and Medication Current Pain Management: Electronic Signature(s) Signed: 05/13/2015 4:28:27 PM By: Alric Quan Entered By: Alric Quan on 05/13/2015 12:55:31 [name] ([id number]) -------------------------------------------------------------------------------- Patient/Caregiver Education Details DOREN, Tyliek 05/13/2015 12:45 Patient Name: Date of Service: W. PM Medical Record Patient Account Number: [id number] Number: Treating RN: Ahmed Prima Date of  Birth/Gender: July 28, 1917 (81 y.o. Male) Other Clinician: Primary Care Treating Roxy Manns, [place] Physician: Physician/Extender: Referring Physician: Dede Query in Treatment: 1 Education Assessment Education Provided To: Patient Education Topics Provided Wound/Skin Impairment: Handouts: Other: do not get wraps wet Methods: Demonstration, Explain/Verbal Responses: State content correctly Electronic Signature(s) Signed: 05/13/2015 4:28:27 PM By: Alric Quan Previous  Signature: 05/13/2015 2:07:49 PM Version By: Judene Companion MD Entered By: Alric Quan on 05/13/2015 14:09:32 [place], Avien W. ([id number]) -------------------------------------------------------------------------------- Wound Assessment Details [place], Arris 05/13/2015 12:45 Patient Name: Date of Service: W. PM Medical Record Patient Account Number: [id number] Number: Treating RN: Ahmed Prima Date of Birth/Sex: June 09, 1917 (81 y.o. Male) Other Clinician: Primary Care Treating Roxy Manns, [place] Physician: Physician/Extender: Referring Physician: Dede Query in Treatment: 1 Wound Status Wound Number: 1 Primary To be determined Etiology: Wound Location: Left Malleolus - Lateral Wound Status: Open Wounding Event: Gradually Appeared Comorbid Glaucoma, Hypertension, Date Acquired: 04/12/2015 History: Osteoarthritis Weeks Of Treatment: 1 Clustered Wound: No Photos Photo Uploaded By: Alric Quan on 05/13/2015 16:19:15 Wound Measurements Length: (cm) 5 Width: (cm) 3 Depth: (cm) 0.3 Area: (cm) 11.781 Volume: (cm) 3.534 % Reduction in Area: 14.3% % Reduction in Volume: 14.3% Epithelialization: None Tunneling: No Undermining: No Wound Description Classification: Partial Thickness Wound Margin: Epibole Exudate Amount: Large Exudate Type: Serous Exudate Color: amber Foul Odor After Cleansing: Yes Due to Product Use: No Wound  Bed Granulation Amount: None Present (0%) Exposed Structure Necrotic Amount: Large (67-100%) Fascia Exposed: No Vath, Ravis W. ([id number]) Necrotic Quality: Adherent Slough Fat Layer Exposed: No Tendon Exposed: No Muscle Exposed: No Joint Exposed: No Bone Exposed: No Limited to Skin Breakdown Periwound Skin Texture Texture Color No Abnormalities Noted: No No Abnormalities Noted: No Localized Edema: Yes Moisture No Abnormalities Noted: No Maceration: Yes Moist: Yes Wound Preparation Ulcer Cleansing: Rinsed/Irrigated with Saline Topical Anesthetic Applied: Other: lidocaine 4%, Treatment Notes Wound #1 (Left, Lateral Malleolus) 1. Cleansed with: Cleanse wound with antibacterial soap and water 2. Anesthetic Topical Lidocaine 4% cream to wound bed prior to debridement 4. Dressing Applied: Aquacel Ag 5. Secondary Dressing Applied ABD Pad Foam 7. Secured with 2 Layer Lite Compression System - Bilateral Electronic Signature(s) Signed: 05/13/2015 4:28:27 PM By: Alric Quan Entered By: Alric Quan on 05/13/2015 13:23:43 [place], Adelard W. ([id number]) -------------------------------------------------------------------------------- Wound Assessment Details Dietrick, Davari 05/13/2015 12:45 Patient Name: Date of Service: W. PM Medical Record Patient Account Number: [id number] Number: Treating RN: Ahmed Prima Date of Birth/Sex: 1917/10/04 (81 y.o. Male) Other Clinician: Primary Care Treating Roxy Manns, [place] Physician: Physician/Extender: Referring Physician: Dede Query in Treatment: 1 Wound Status Wound Number: 2 Primary Trauma, Other Etiology: Wound Location: Left Lower Leg - Midline, Proximal Wound Status: Open Wounding Event: Radiation Burn Comorbid Glaucoma, Hypertension, History: Osteoarthritis Date Acquired: 05/02/2014 Weeks Of Treatment: 1 Clustered Wound: No Photos Photo Uploaded By: Alric Quan  on 05/13/2015 16:20:01 Wound Measurements Length: (cm) 1 Width: (cm) 1 Depth: (cm) 0.1 Area: (cm) 0.785 Volume: (cm) 0.079 % Reduction in Area: 16.7% % Reduction in Volume: 16% Epithelialization: None Tunneling: No Undermining: No Wound Description Classification: Partial Thickness Exudate Amount: Large Exudate Type: Serous Exudate Color: amber Foul Odor After Cleansing: Yes Due to Product Use: No Wound Bed Granulation Amount: Large (67-100%) Exposed Structure Granulation Quality: Red, Pink Fascia Exposed: No Hellard, Zelma W. ([id number]) Necrotic Amount: Small (1-33%) Fat Layer Exposed: No Necrotic Quality: Adherent Slough Tendon Exposed: No Muscle Exposed: No Joint Exposed: No Bone Exposed: No Limited to Skin Breakdown Periwound Skin Texture Texture Color No Abnormalities Noted: No No Abnormalities Noted: No Localized Edema: Yes Temperature / Pain Moisture Temperature: No Abnormality No Abnormalities Noted: No Tenderness on Palpation: Yes Maceration: Yes Moist: Yes Wound Preparation Ulcer Cleansing: Rinsed/Irrigated with Saline Topical Anesthetic Applied: Other: lidocaine 4%, Treatment Notes Wound #2 (Left, Proximal, Midline Lower Leg) 1. Cleansed with:  Cleanse wound with antibacterial soap and water 2. Anesthetic Topical Lidocaine 4% cream to wound bed prior to debridement 4. Dressing Applied: Aquacel Ag 5. Secondary Dressing Applied ABD Pad Foam 7. Secured with 2 Layer Lite Compression System - Bilateral Electronic Signature(s) Signed: 05/13/2015 4:28:27 PM By: Alric Quan Entered By: Alric Quan on 05/13/2015 13:24:49 Reetz, Jedaiah W. ([id number]) -------------------------------------------------------------------------------- Wound Assessment Details Fiebig, Wendell 05/13/2015 12:45 Patient Name: Date of Service: W. PM Medical Record Patient Account Number: [id number] Number: Treating RN: Ahmed Prima Date of Birth/Sex: 1917/10/27 (81 y.o. Male) Other Clinician: Primary Care Treating Roxy Manns, [place] Physician: Physician/Extender: Referring Physician: Dede Query in Treatment: 1 Wound Status Wound Number: 3 Primary Trauma, Other Etiology: Wound Location: Left Lower Leg - Distal, Circumfernential Wound Status: Open Wounding Event: Radiation Burn Comorbid Glaucoma, Hypertension, History: Osteoarthritis Date Acquired: 05/02/2014 Weeks Of Treatment: 1 Clustered Wound: No Photos Photo Uploaded By: Alric Quan on 05/13/2015 16:20:01 Wound Measurements Length: (cm) 16.4 Width: (cm) 25 Depth: (cm) 0.1 Area: (cm) 322.013 Volume: (cm) 32.201 % Reduction in Area: 14% % Reduction in Volume: 14% Epithelialization: None Tunneling: No Undermining: No Wound Description Classification: Partial Thickness Wound Margin: Flat and Intact Exudate Amount: Large Exudate Type: Serosanguineous Exudate Color: red, brown Foul Odor After Cleansing: Yes Due to Product Use: No Wound Bed Granulation Amount: Small (1-33%) Awwad, Abby W. ([id number]) Granulation Quality: Pink Necrotic Amount: Large (67-100%) Necrotic Quality: [place], Adherent Slough Periwound Skin Texture Texture Color No Abnormalities Noted: No No Abnormalities Noted: No Localized Edema: Yes Temperature / Pain Moisture Temperature: No Abnormality No Abnormalities Noted: No Tenderness on Palpation: Yes Dry / Scaly: Yes Maceration: Yes Moist: Yes Wound Preparation Ulcer Cleansing: Rinsed/Irrigated with Saline Topical Anesthetic Applied: Other: lidocaine 4%, Treatment Notes Wound #3 (Left, Distal, Circumferential Lower Leg) 1. Cleansed with: Cleanse wound with antibacterial soap and water 2. Anesthetic Topical Lidocaine 4% cream to wound bed prior to debridement 4. Dressing Applied: Aquacel Ag 5. Secondary Dressing Applied ABD Pad Foam 7. Secured with 2 Layer Lite Compression  System - Bilateral Electronic Signature(s) Signed: 05/13/2015 4:28:27 PM By: Alric Quan Entered By: Alric Quan on 05/13/2015 13:26:32 Raggio, Casmer Viona Gilmore ([id number]) -------------------------------------------------------------------------------- Wound Assessment Details Markowicz, Jaret 05/13/2015 12:45 Patient Name: Date of Service: W. PM Medical Record Patient Account Number: [id number] Number: Treating RN: Ahmed Prima Date of Birth/Sex: Apr 21, 1918 (81 y.o. Male) Other Clinician: Primary Care Treating Roxy Manns, [place] Physician: Physician/Extender: Referring Physician: Dede Query in Treatment: 1 Wound Status Wound Number: 4 Primary To be determined Etiology: Wound Location: Right, Proximal, Lateral Lower Leg Wound Status: Open Wounding Event: Blister Comorbid Glaucoma, Hypertension, History: Osteoarthritis Date Acquired: 05/02/2014 Weeks Of Treatment: 1 Clustered Wound: No Photos Photo Uploaded By: Alric Quan on 05/13/2015 16:21:14 Wound Measurements Length: (cm) 2 Width: (cm) 0.5 Depth: (cm) 0.1 Area: (cm) 0.785 Volume: (cm) 0.079 % Reduction in Area: 0% % Reduction in Volume: 0% Epithelialization: None Wound Description Classification: Partial Thickness Wound Margin: Flat and Intact Exudate Amount: Medium Exudate Type: Serous Exudate Color: amber Foul Odor After Cleansing: No Wound Bed Granulation Amount: None Present (0%) Modica, Chayson W. ([id number]) Necrotic Amount: Large (67-100%) Necrotic Quality: Adherent Slough Periwound Skin Texture Texture Color No Abnormalities Noted: No No Abnormalities Noted: No Localized Edema: Yes Temperature / Pain Moisture Temperature: No Abnormality No Abnormalities Noted: No Tenderness on Palpation: Yes Moist: Yes Wound Preparation Ulcer Cleansing: Rinsed/Irrigated with Saline Topical Anesthetic Applied: Other: lidocaine 4%, Treatment Notes Wound #4  (Right, Proximal, Lateral  Lower Leg) 1. Cleansed with: Cleanse wound with antibacterial soap and water 2. Anesthetic Topical Lidocaine 4% cream to wound bed prior to debridement 4. Dressing Applied: Aquacel Ag 5. Secondary Dressing Applied ABD Pad Foam 7. Secured with 2 Layer Lite Compression System - Bilateral Electronic Signature(s) Signed: 05/13/2015 4:28:27 PM By: Alric Quan Entered By: Alric Quan on 05/13/2015 13:30:59 Jaroszewski, Kayce W. ([id number]) -------------------------------------------------------------------------------- Wound Assessment Details Helvey, Shayon 05/13/2015 12:45 Patient Name: Date of Service: W. PM Medical Record Patient Account Number: [id number] Number: Treating RN: Ahmed Prima Date of Birth/Sex: 11-01-1917 (81 y.o. Male) Other Clinician: Primary Care Treating Roxy Manns, [place] Physician: Physician/Extender: Referring Physician: Dede Query in Treatment: 1 Wound Status Wound Number: 6 Primary Venous Leg Ulcer Etiology: Wound Location: Right, Lateral Lower Leg Wound Status: Open Wounding Event: Gradually Appeared Comorbid Glaucoma, Hypertension, Date Acquired: 05/13/2015 History: Osteoarthritis Weeks Of Treatment: 0 Clustered Wound: No Photos Photo Uploaded By: Alric Quan on 05/13/2015 16:22:31 Wound Measurements Length: (cm) 2.5 Width: (cm) 2.7 Depth: (cm) 0.1 Area: (cm) 5.301 Volume: (cm) 0.53 % Reduction in Area: % Reduction in Volume: Epithelialization: None Tunneling: No Undermining: No Wound Description Classification: Partial Thickness Wound Margin: Flat and Intact Exudate Amount: Large Exudate Type: Serous Exudate Color: amber Foul Odor After Cleansing: No Wound Bed Granulation Amount: None Present (0%) Exposed Structure Necrotic Amount: Large (67-100%) Fascia Exposed: No Zollars, Asencion W. ([id number]) Necrotic Quality: Adherent Slough Fat Layer  Exposed: No Tendon Exposed: No Muscle Exposed: No Joint Exposed: No Bone Exposed: No Limited to Skin Breakdown Periwound Skin Texture Texture Color No Abnormalities Noted: No No Abnormalities Noted: No Localized Edema: Yes Moisture No Abnormalities Noted: No Moist: Yes Wound Preparation Ulcer Cleansing: Other: soap and water, Topical Anesthetic Applied: Other: lidocaine 4%, Treatment Notes Wound #6 (Right, Lateral Lower Leg) 1. Cleansed with: Cleanse wound with antibacterial soap and water 2. Anesthetic Topical Lidocaine 4% cream to wound bed prior to debridement 4. Dressing Applied: Aquacel Ag 5. Secondary Dressing Applied ABD Pad Foam 7. Secured with 2 Layer Lite Compression System - Bilateral Electronic Signature(s) Signed: 05/13/2015 4:28:27 PM By: Alric Quan Entered By: Alric Quan on 05/13/2015 13:30:41 [place], [place] Hallie W. ([id number]) -------------------------------------------------------------------------------- Vitals Details Frie, Raydan 05/13/2015 12:45 Patient Name: Date of Service: W. PM Medical Record Patient Account Number: [id number] Number: Treating RN: Ahmed Prima Date of Birth/Sex: 27-May-1917 (81 y.o. Male) Other Clinician: Primary Care Treating Roxy Manns, [place] Physician: Physician/Extender: Referring Physician: Dede Query in Treatment: 1 Vital Signs Time Taken: 12:55 Temperature (F): 98.0 Height (in): 70 Pulse (bpm): 52 Weight (lbs): 180 Respiratory Rate (breaths/min): 18 Body Mass Index (BMI): 25.8 Blood Pressure (mmHg): 147/58 Reference Range: 80 - 120 mg / dl Electronic Signature(s) Signed: 05/13/2015 4:28:27 PM By: Alric Quan Entered By: Alric Quan on 05/13/2015 12:57:37

## 2015-05-20 ENCOUNTER — Emergency Department (HOSPITAL_COMMUNITY): Payer: Medicare Other

## 2015-05-20 ENCOUNTER — Ambulatory Visit: Payer: Medicare Other | Admitting: Surgery

## 2015-05-20 ENCOUNTER — Encounter (HOSPITAL_COMMUNITY): Payer: Self-pay | Admitting: Emergency Medicine

## 2015-05-20 ENCOUNTER — Inpatient Hospital Stay (HOSPITAL_COMMUNITY)
Admission: EM | Admit: 2015-05-20 | Discharge: 2015-05-24 | DRG: 536 | Disposition: A | Discharge status: Skilled Nursing Facility | Payer: Medicare Other | Attending: Internal Medicine | Admitting: Internal Medicine

## 2015-05-20 ENCOUNTER — Inpatient Hospital Stay (HOSPITAL_COMMUNITY): Payer: Medicare Other

## 2015-05-20 DIAGNOSIS — L97929 Non-pressure chronic ulcer of unspecified part of left lower leg with unspecified severity: Secondary | ICD-10-CM | POA: Diagnosis present

## 2015-05-20 DIAGNOSIS — Z923 Personal history of irradiation: Secondary | ICD-10-CM

## 2015-05-20 DIAGNOSIS — D72829 Elevated white blood cell count, unspecified: Secondary | ICD-10-CM | POA: Diagnosis present

## 2015-05-20 DIAGNOSIS — Y92009 Unspecified place in unspecified non-institutional (private) residence as the place of occurrence of the external cause: Secondary | ICD-10-CM | POA: Diagnosis not present

## 2015-05-20 DIAGNOSIS — W19XXXA Unspecified fall, initial encounter: Secondary | ICD-10-CM | POA: Insufficient documentation

## 2015-05-20 DIAGNOSIS — S72101D Unspecified trochanteric fracture of right femur, subsequent encounter for closed fracture with routine healing: Secondary | ICD-10-CM | POA: Diagnosis not present

## 2015-05-20 DIAGNOSIS — I10 Essential (primary) hypertension: Secondary | ICD-10-CM | POA: Diagnosis present

## 2015-05-20 DIAGNOSIS — Z8551 Personal history of malignant neoplasm of bladder: Secondary | ICD-10-CM

## 2015-05-20 DIAGNOSIS — S72009A Fracture of unspecified part of neck of unspecified femur, initial encounter for closed fracture: Secondary | ICD-10-CM | POA: Diagnosis present

## 2015-05-20 DIAGNOSIS — E872 Acidosis, unspecified: Secondary | ICD-10-CM | POA: Insufficient documentation

## 2015-05-20 DIAGNOSIS — R52 Pain, unspecified: Secondary | ICD-10-CM

## 2015-05-20 DIAGNOSIS — Z6826 Body mass index (BMI) 26.0-26.9, adult: Secondary | ICD-10-CM

## 2015-05-20 DIAGNOSIS — Z85828 Personal history of other malignant neoplasm of skin: Secondary | ICD-10-CM | POA: Diagnosis not present

## 2015-05-20 DIAGNOSIS — Z79899 Other long term (current) drug therapy: Secondary | ICD-10-CM | POA: Diagnosis not present

## 2015-05-20 DIAGNOSIS — S72101A Unspecified trochanteric fracture of right femur, initial encounter for closed fracture: Secondary | ICD-10-CM | POA: Diagnosis present

## 2015-05-20 DIAGNOSIS — R7989 Other specified abnormal findings of blood chemistry: Secondary | ICD-10-CM | POA: Diagnosis present

## 2015-05-20 DIAGNOSIS — W19XXXS Unspecified fall, sequela: Secondary | ICD-10-CM | POA: Diagnosis not present

## 2015-05-20 DIAGNOSIS — Z7902 Long term (current) use of antithrombotics/antiplatelets: Secondary | ICD-10-CM | POA: Diagnosis not present

## 2015-05-20 DIAGNOSIS — W010XXA Fall on same level from slipping, tripping and stumbling without subsequent striking against object, initial encounter: Secondary | ICD-10-CM | POA: Diagnosis present

## 2015-05-20 DIAGNOSIS — F17200 Nicotine dependence, unspecified, uncomplicated: Secondary | ICD-10-CM | POA: Diagnosis present

## 2015-05-20 DIAGNOSIS — I739 Peripheral vascular disease, unspecified: Secondary | ICD-10-CM | POA: Diagnosis present

## 2015-05-20 DIAGNOSIS — H9190 Unspecified hearing loss, unspecified ear: Secondary | ICD-10-CM | POA: Diagnosis present

## 2015-05-20 DIAGNOSIS — Y92099 Unspecified place in other non-institutional residence as the place of occurrence of the external cause: Secondary | ICD-10-CM

## 2015-05-20 DIAGNOSIS — S72001A Fracture of unspecified part of neck of right femur, initial encounter for closed fracture: Secondary | ICD-10-CM | POA: Insufficient documentation

## 2015-05-20 DIAGNOSIS — M25551 Pain in right hip: Secondary | ICD-10-CM | POA: Diagnosis present

## 2015-05-20 DIAGNOSIS — E440 Moderate protein-calorie malnutrition: Secondary | ICD-10-CM | POA: Diagnosis present

## 2015-05-20 HISTORY — DX: Peripheral vascular disease, unspecified: I73.9

## 2015-05-20 LAB — I-STAT CG4 LACTIC ACID, ED
Lactic Acid, Venous: 4.2 mmol/L (ref 0.5–2.0)
Lactic Acid, Venous: 1.05 mmol/L (ref 0.5–2.0)
Lactic Acid, Venous: 0.93 mmol/L (ref 0.5–2.0)

## 2015-05-20 LAB — COMPREHENSIVE METABOLIC PANEL
ALBUMIN: 3.5 g/dL (ref 3.5–5.0)
ALT: 14 U/L — ABNORMAL LOW (ref 17–63)
AST: 23 U/L (ref 15–41)
Alkaline Phosphatase: 70 U/L (ref 38–126)
Anion gap: 12 (ref 5–15)
BILIRUBIN TOTAL: 0.8 mg/dL (ref 0.3–1.2)
BUN: 30 mg/dL — AB (ref 6–20)
CO2: 19 mmol/L — ABNORMAL LOW (ref 22–32)
Calcium: 9.4 mg/dL (ref 8.9–10.3)
Chloride: 106 mmol/L (ref 101–111)
Creatinine, Ser: 1.2 mg/dL (ref 0.61–1.24)
GFR calc Af Amer: 57 mL/min — ABNORMAL LOW (ref >60)
GFR calc non Af Amer: 50 mL/min — ABNORMAL LOW (ref >60)
GLUCOSE: 138 mg/dL — AB (ref 65–99)
POTASSIUM: 4.5 mmol/L (ref 3.5–5.1)
Sodium: 137 mmol/L (ref 135–145)
TOTAL PROTEIN: 6.9 g/dL (ref 6.5–8.1)

## 2015-05-20 LAB — CBC WITH DIFFERENTIAL/PLATELET
BASOS ABS: 0 10*3/uL (ref 0.0–0.1)
BASOS PCT: 0 %
Eosinophils Absolute: 0 10*3/uL (ref 0.0–0.7)
Eosinophils Relative: 0 %
HEMATOCRIT: 36.8 % — AB (ref 39.0–52.0)
HEMOGLOBIN: 11.9 g/dL — AB (ref 13.0–17.0)
Lymphocytes Relative: 11 %
Lymphs Abs: 1.6 10*3/uL (ref 0.7–4.0)
MCH: 31.8 pg (ref 26.0–34.0)
MCHC: 32.3 g/dL (ref 30.0–36.0)
MCV: 98.4 fL (ref 78.0–100.0)
Monocytes Absolute: 0.8 10*3/uL (ref 0.1–1.0)
Monocytes Relative: 6 %
NEUTROS ABS: 12.1 10*3/uL — AB (ref 1.7–7.7)
NEUTROS PCT: 83 %
Platelets: 260 10*3/uL (ref 150–400)
RBC: 3.74 MIL/uL — AB (ref 4.22–5.81)
RDW: 14.2 % (ref 11.5–15.5)
WBC: 14.6 10*3/uL — AB (ref 4.0–10.5)

## 2015-05-20 LAB — URINALYSIS, ROUTINE W REFLEX MICROSCOPIC
Bilirubin Urine: NEGATIVE
Glucose, UA: NEGATIVE mg/dL
Hgb urine dipstick: NEGATIVE
Ketones, ur: NEGATIVE mg/dL
LEUKOCYTES UA: NEGATIVE
NITRITE: NEGATIVE
PROTEIN: NEGATIVE mg/dL
Specific Gravity, Urine: 1.018 (ref 1.005–1.030)
pH: 6.5 (ref 5.0–8.0)

## 2015-05-20 LAB — APTT: APTT: 30 s (ref 24–37)

## 2015-05-20 LAB — HEPATIC FUNCTION PANEL: BILIRUBIN, TOTAL: 0.8 mg/dL

## 2015-05-20 LAB — I-STAT CHEM 8, ED
BUN: 31 mg/dL — AB (ref 6–20)
CREATININE: 1 mg/dL (ref 0.61–1.24)
Calcium, Ion: 1.22 mmol/L (ref 1.13–1.30)
Chloride: 106 mmol/L (ref 101–111)
Glucose, Bld: 136 mg/dL — ABNORMAL HIGH (ref 65–99)
HEMATOCRIT: 42 % (ref 39.0–52.0)
Hemoglobin: 14.3 g/dL (ref 13.0–17.0)
POTASSIUM: 4.3 mmol/L (ref 3.5–5.1)
Sodium: 140 mmol/L (ref 135–145)
TCO2: 20 mmol/L (ref 0–100)

## 2015-05-20 LAB — LACTIC ACID, PLASMA: Lactic Acid, Venous: 1 mmol/L (ref 0.5–2.0)

## 2015-05-20 LAB — PROTIME-INR
INR: 1.21 (ref 0.00–1.49)
Prothrombin Time: 15.5 seconds — ABNORMAL HIGH (ref 11.6–15.2)

## 2015-05-20 LAB — I-STAT TROPONIN, ED: TROPONIN I, POC: 0.02 ng/mL (ref 0.00–0.08)

## 2015-05-20 LAB — CK: CK TOTAL: 69 U/L (ref 49–397)

## 2015-05-20 MED ORDER — SODIUM CHLORIDE 0.9 % IV BOLUS (SEPSIS)
550.0000 mL | Freq: Once | INTRAVENOUS | Status: AC
  Administered 2015-05-20: 550 mL via INTRAVENOUS

## 2015-05-20 MED ORDER — SODIUM CHLORIDE 0.9 % IV SOLN: 1250.0000 mg | INTRAVENOUS | Status: DC

## 2015-05-20 MED ORDER — HYDROCODONE-ACETAMINOPHEN 5-325 MG PO TABS: 1.0000 | ORAL_TABLET | ORAL | Status: DC | PRN

## 2015-05-20 MED ORDER — PIPERACILLIN-TAZOBACTAM 3.375 G IVPB 30 MIN: 3.3750 g | Freq: Once | INTRAVENOUS | Status: DC

## 2015-05-20 MED ORDER — FINASTERIDE 5 MG PO TABS
5.0000 mg | ORAL_TABLET | Freq: Every day | ORAL | Status: DC
Start: 2015-05-20 — End: 2015-05-24
  Administered 2015-05-20 – 2015-05-24 (×5): 5 mg via ORAL
  Filled 2015-05-20 (×5): qty 1

## 2015-05-20 MED ORDER — BRINZOLAMIDE 1 % OP SUSP
1.0000 [drp] | Freq: Three times a day (TID) | OPHTHALMIC | Status: DC
  Administered 2015-05-20 – 2015-05-24 (×10): 1 [drp] via OPHTHALMIC
  Filled 2015-05-20: qty 10

## 2015-05-20 MED ORDER — BENAZEPRIL HCL 40 MG PO TABS
40.0000 mg | ORAL_TABLET | Freq: Every day | ORAL | Status: DC
  Administered 2015-05-22 – 2015-05-24 (×3): 40 mg via ORAL
  Filled 2015-05-20: qty 4
  Filled 2015-05-20 (×3): qty 1

## 2015-05-20 MED ORDER — SODIUM CHLORIDE 0.9 % IV BOLUS (SEPSIS)
1000.0000 mL | Freq: Once | INTRAVENOUS | Status: AC
  Administered 2015-05-20: 1000 mL via INTRAVENOUS

## 2015-05-20 MED ORDER — MORPHINE SULFATE (PF) 4 MG/ML IV SOLN
4.0000 mg | Freq: Once | INTRAVENOUS | Status: AC
  Administered 2015-05-20: 4 mg via INTRAVENOUS
  Filled 2015-05-20: qty 1

## 2015-05-20 MED ORDER — TAMSULOSIN HCL 0.4 MG PO CAPS
0.4000 mg | ORAL_CAPSULE | Freq: Every day | ORAL | Status: DC
Start: 2015-05-21 — End: 2015-05-24
  Administered 2015-05-23 – 2015-05-24 (×3): 0.4 mg via ORAL
  Filled 2015-05-20 (×3): qty 1

## 2015-05-20 MED ORDER — AMLODIPINE BESYLATE 10 MG PO TABS
10.0000 mg | ORAL_TABLET | Freq: Every day | ORAL | Status: DC
  Administered 2015-05-21 – 2015-05-24 (×4): 10 mg via ORAL
  Filled 2015-05-20 (×6): qty 1

## 2015-05-20 MED ORDER — VANCOMYCIN HCL 10 G IV SOLR
1250.0000 mg | Freq: Once | INTRAVENOUS | Status: DC
  Filled 2015-05-20: qty 1250

## 2015-05-20 MED ORDER — HYDRALAZINE HCL 10 MG PO TABS
5.0000 mg | ORAL_TABLET | Freq: Two times a day (BID) | ORAL | Status: DC
  Administered 2015-05-20 – 2015-05-24 (×7): 5 mg via ORAL
  Filled 2015-05-20 (×7): qty 1

## 2015-05-20 MED ORDER — LATANOPROST 0.005 % OP SOLN
1.0000 [drp] | Freq: Every day | OPHTHALMIC | Status: DC
  Administered 2015-05-20 – 2015-05-23 (×3): 1 [drp] via OPHTHALMIC
  Filled 2015-05-20 (×2): qty 2.5

## 2015-05-20 MED ORDER — BRIMONIDINE TARTRATE 0.15 % OP SOLN
1.0000 [drp] | Freq: Two times a day (BID) | OPHTHALMIC | Status: DC
  Administered 2015-05-20 – 2015-05-24 (×7): 1 [drp] via OPHTHALMIC
  Filled 2015-05-20: qty 5

## 2015-05-20 MED ORDER — PIPERACILLIN-TAZOBACTAM 3.375 G IVPB: 3.3750 g | Freq: Three times a day (TID) | INTRAVENOUS | Status: DC

## 2015-05-20 MED ORDER — POLYETHYLENE GLYCOL 3350 17 G PO PACK: 17.0000 g | PACK | Freq: Every day | ORAL | Status: DC | PRN

## 2015-05-20 MED ORDER — VANCOMYCIN HCL 10 G IV SOLR: 15.0000 mg/kg | Freq: Once | INTRAVENOUS | Status: DC

## 2015-05-20 MED ORDER — ENOXAPARIN SODIUM 40 MG/0.4ML ~~LOC~~ SOLN
40.0000 mg | SUBCUTANEOUS | Status: DC
  Administered 2015-05-20 – 2015-05-23 (×3): 40 mg via SUBCUTANEOUS
  Filled 2015-05-20 (×3): qty 0.4

## 2015-05-20 MED ORDER — HYDROCODONE-ACETAMINOPHEN 5-325 MG PO TABS
1.0000 | ORAL_TABLET | Freq: Four times a day (QID) | ORAL | Status: DC | PRN
  Administered 2015-05-21: 1 via ORAL
  Filled 2015-05-20: qty 1

## 2015-05-20 MED ORDER — MORPHINE SULFATE (PF) 2 MG/ML IV SOLN
0.5000 mg | INTRAVENOUS | Status: DC | PRN
  Administered 2015-05-21 (×2): 0.5 mg via INTRAVENOUS
  Filled 2015-05-20 (×2): qty 1

## 2015-05-20 MED ORDER — AMLODIPINE BESY-BENAZEPRIL HCL 10-40 MG PO CAPS: 1.0000 | ORAL_CAPSULE | Freq: Every day | ORAL | Status: DC

## 2015-05-20 NOTE — H&P (Signed)
[facility] History and Physical  [name] [id number] DOB: 05-Mar-1918 DOA: 05/20/2015  Referring physician: Emergency Department PCP: [name] Panda, MD   CHIEF COMPLAINT:   fall at home      HPI: [name] is a 82 y.o. male who fell at home during the night. Home health provider found him on floor. Per daughter, patient fell around 6:00am while trying to get out of bed to urinate. Patient very hard of hearing, most of the history comes from the daughter and granddaughter. Patient "hurts all over" Daughter states patient is  usually alert and oriented. He just recieved morphine.    ED COURSE:   Labs:   WBC 14.6, hgb 14.3.  Lactic acid 4.2, troponin 0.02 INR 1.21  Urinalysis:    Negative             CXR:    No active disease          EKG:    Sinus rhythm LAD, consider left anterior fascicular block Probable anteroseptal infarct, old QT 435                  Medications  sodium chloride 0.9 % bolus 1,000 mL (not administered)  morphine 4 MG/ML injection 4 mg (not administered)  sodium chloride 0.9 % bolus 1,000 mL (1,000 mLs Intravenous New Bag/Given 05/20/15 1028)  sodium chloride 0.9 % bolus 550 mL (550 mLs Intravenous New Bag/Given 05/20/15 1034)    Review of Systems  Constitutional: Negative.   HENT: Negative.   Eyes: Negative.   Respiratory: Negative.   Cardiovascular: Negative.   Gastrointestinal: Negative.   Genitourinary: Negative.   Musculoskeletal: Positive for falls.  Skin: Negative.   Neurological: Negative.   Endo/Heme/Allergies: Negative.   Psychiatric/Behavioral: Negative.    Past Medical History  Diagnosis Date  . Arthritis   . Cancer ([place])     bladder  . Hypertension   . S/P radiation therapy 02/15/2015 through 03/18/2015    Left pretibial region 4400 cGy in 10 sessions, two sessions a week for 5 weeks   . PAD (peripheral artery disease) ([place])   . PVD (peripheral vascular disease) ([place])    PSH:  achilles tendon   SOCIAL HISTORY:  reports that he has been smoking Pipe.  He has never used smokeless tobacco. He reports that he drinks about 1.2 oz of alcohol per week. He reports that he does not use illicit drugs. Lives: alone at home with   Assistive devices:   walker needed for ambulation.   No Known Allergies  [place] Arthritis - father Hearing loss-father Arthritis -mother  Prior to Admission medications   Medication Sig Start Date End Date Taking? Authorizing Provider  acetaminophen (TYLENOL) 500 MG tablet Take 1,000 mg by mouth every 4 (four) hours as needed for moderate pain.   Yes Historical Provider, MD  amLODipine-benazepril (LOTREL) 10-40 MG per capsule Take 1 capsule by mouth daily.   Yes Historical Provider, MD  brimonidine (ALPHAGAN) 0.15 % ophthalmic solution Place 1 drop into both eyes 2 (two) times daily.   Yes Historical Provider, MD  brinzolamide (AZOPT) 1 % ophthalmic suspension Place 1 drop into both eyes 3 (three) times daily.   Yes Historical Provider, MD  clopidogrel (PLAVIX) 75 MG tablet Take 75 mg by mouth daily with breakfast.   Yes Historical Provider, MD  finasteride (PROSCAR) 5 MG tablet Take 5 mg by mouth daily.   Yes Historical Provider, MD  hydrALAZINE (APRESOLINE) 10 MG tablet Take 5 mg by  mouth 2 (two) times daily. TAKE 1/2 TABLET   Yes Historical Provider, MD  latanoprost (XALATAN) 0.005 % ophthalmic solution Place 1 drop into both eyes at bedtime.   Yes Historical Provider, MD  MEGARED OMEGA-3 KRILL OIL 500 MG CAPS Take 1 tablet by mouth daily.   Yes Historical Provider, MD  Multiple Vitamins-Minerals (ICAPS AREDS 2 PO) Take 1 tablet by mouth.   Yes Historical Provider, MD  tamsulosin (FLOMAX) 0.4 MG CAPS capsule Take 0.4 mg by mouth daily after supper.   Yes Historical Provider, MD  traMADol (ULTRAM) 50 MG tablet Take 50-100 mg by mouth 3 (three) times daily. 1 in the morning, 1 in the evening, and 2 at bedtime   Yes Historical Provider, MD   PHYSICAL  EXAM: Filed Vitals:   05/20/15 0944 05/20/15 1048  BP: 148/57 149/69  Pulse: 89 65  Temp: 99.1 F (37.3 C)   TempSrc: Rectal   Resp: 17 14  Height: 5\' 10"  (1.778 m)   Weight: 85.276 kg (188 lb)   SpO2: 100% 100%    Wt Readings from Last 3 Encounters:  05/20/15 85.276 kg (188 lb)  01/21/15 76.839 kg (169 lb 6.4 oz)  08/22/13 76.2 kg (167 lb 15.9 oz)    General:  Pleasant white male. Appears calm and comfortable Eyes: PER, normal lids, irises & conjunctiva ENT: HOH, grossly normal hearing, lips & tongue Neck: no LAD, no masses Cardiovascular: RRR, + murmur, 2+ BLE edema  Respiratory: Respirations even and unlabored.Inspiratory crackles bilateral lower lobes. Abdomen: soft, non-distended, non-tender, active bowel sounds. No obvious masses.  Skin: unwrapped BLE dressing.  Medial aspect of left ankle with 2 large wounds, granulation tissue present, mild drainage. Lateral side of right ankle with large wound draining malodorous greyish green material. A small, healing would left pre-tibial region. Wounds on bilateral heals non draining.  Musculoskeletal: grossly normal tone BUE/BLE Psychiatric: grossly normal mood and affect, speech fluent and appropriate Neurologic: grossly non-focal.         LABS ON ADMISSION:    Basic Metabolic Panel:  Recent Labs Lab 05/20/15 0940 05/20/15 1009  NA 137 140  K 4.5 4.3  CL 106 106  CO2 19*  --   GLUCOSE 138* 136*  BUN 30* 31*  CREATININE 1.20 1.00  CALCIUM 9.4  --    Liver Function Tests:  Recent Labs Lab 05/20/15 0940  AST 23  ALT 14*  ALKPHOS 70  BILITOT 0.8  PROT 6.9  ALBUMIN 3.5   CBC:  Recent Labs Lab 05/20/15 0940 05/20/15 1009  WBC 14.6*  --   NEUTROABS 12.1*  --   HGB 11.9* 14.3  HCT 36.8* 42.0  MCV 98.4  --   PLT 260  --     CREATININE: 1 (05/20/15 1009) Estimated creatinine clearance - 45.6 mL/min  Radiological Exams on Admission: Ct Head Wo Contrast  05/20/2015  CLINICAL DATA:  Fall on Plavix.   Found on floor.  Initial encounter. EXAM: CT HEAD WITHOUT CONTRAST CT CERVICAL SPINE WITHOUT CONTRAST TECHNIQUE: Multidetector CT imaging of the head and cervical spine was performed following the standard protocol without intravenous contrast. Multiplanar CT image reconstructions of the cervical spine were also generated. COMPARISON:  None. FINDINGS: CT HEAD FINDINGS Skull and Sinuses:Negative for fracture. There is subtle deep soft tissue thickening at the vertex, not convincing for acute contusion. Bilateral mild chronic mastoiditis with sclerosis and opacification in the mastoid tips. Visualized orbits: No traumatic finding.  Right cataract resection Brain: No evidence of  acute infarction, hemorrhage, hydrocephalus, or mass lesion/mass effect. Age congruent generalized volume loss and small vessel ischemic change in the deep cerebral white matter. CT CERVICAL SPINE FINDINGS Negative for acute fracture or subluxation. No prevertebral edema. No gross cervical canal hematoma. Multilevel disc degeneration with occasional calcification. There is notable bulging disc at C4-5 with ventral cord contact but no suspected mass-effect. Biapical pleural based scarring. IMPRESSION: No evidence of intracranial or cervical spine injury. Electronically Signed   By: Monte Fantasia M.D.   On: 05/20/2015 10:17   Ct Cervical Spine Wo Contrast  05/20/2015  CLINICAL DATA:  Fall on Plavix.  Found on floor.  Initial encounter. EXAM: CT HEAD WITHOUT CONTRAST CT CERVICAL SPINE WITHOUT CONTRAST TECHNIQUE: Multidetector CT imaging of the head and cervical spine was performed following the standard protocol without intravenous contrast. Multiplanar CT image reconstructions of the cervical spine were also generated. COMPARISON:  None. FINDINGS: CT HEAD FINDINGS Skull and Sinuses:Negative for fracture. There is subtle deep soft tissue thickening at the vertex, not convincing for acute contusion. Bilateral mild chronic mastoiditis with  sclerosis and opacification in the mastoid tips. Visualized orbits: No traumatic finding.  Right cataract resection Brain: No evidence of acute infarction, hemorrhage, hydrocephalus, or mass lesion/mass effect. Age congruent generalized volume loss and small vessel ischemic change in the deep cerebral white matter. CT CERVICAL SPINE FINDINGS Negative for acute fracture or subluxation. No prevertebral edema. No gross cervical canal hematoma. Multilevel disc degeneration with occasional calcification. There is notable bulging disc at C4-5 with ventral cord contact but no suspected mass-effect. Biapical pleural based scarring. IMPRESSION: No evidence of intracranial or cervical spine injury. Electronically Signed   By: Monte Fantasia M.D.   On: 05/20/2015 10:17   Dg Pelvis Portable  05/20/2015  CLINICAL DATA:  Status post fall this morning. EXAM: PORTABLE PELVIS 1-2 VIEWS COMPARISON:  None. FINDINGS: There is an isolated right greater trochanteric fracture. There is no other fracture or dislocation. 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 IMPRESSION: Isolated right greater trochanteric fracture on a single AP view. If there is further clinical concern, recommend dedicated right hip views or a CT of the right hip. Electronically Signed   By: Kathreen Devoid   On: 05/20/2015 10:41   Dg Chest Portable 1 View  05/20/2015  CLINICAL DATA:  Status post fall.  Sore. EXAM: PORTABLE CHEST 1 VIEW COMPARISON:  08/22/2013 FINDINGS: There is bilateral chronic interstitial thickening. There is no focal consolidation. There is no pleural effusion or pneumothorax. The heart and mediastinal contours are unremarkable. There is thoracic aortic atherosclerosis. There is a small hiatal hernia. The osseous structures are unremarkable. IMPRESSION: No active disease. Electronically Signed   By: Kathreen Devoid   On: 05/20/2015 10:45    ASSESSMENT / PLAN   Active Problems:   Leukocytosis   Trochanteric fracture of right femur  (H[facility])   Elevated lactic acid level  Right trocanteric fracture post fall at home this am. Ortho called by EDP -admit to medical bed -Awaiting CTscan. Whether or not patient will require surgery depending on extent of fracture. We will need to call Ortho back if needed.  -Ortho okay with feeding today.    Leukocytosis / elevated lactic acid. Lower leg wounds may be responsible.  Doesn't meet sepsis criteria despite elevated lactic acid. He has received 2 liters of IVF in ED.  -blood and urine culture -monitor lactic acid levels.  -CXR without acute findings -no antibiotics for now, leg wounds don't look that  bad. U/A and CXR unremarkable. Will obtain blood and urine cultures.   PAD / PVD with chronic (years) lower extremity ulcers. Declined surgical intervention. Recently started going to wound care in [place] evaluation  Left anterior tibial basal cell carcinoma, s/p radiation  CONSULTANTS:  Orthopedics - informal consult over phone. Will see if CTscan shows more extensive fracture than the plain film showed.      Code Status: limited code. Doesn't want chest compression. Cardiac meds and intubation okay DVT Prophylaxis: Lovenox Heparin, already anti-coagulated Family Communication:  Patient alert, oriented and understands plan of care.  Disposition Plan: Discharge to home or SNF (depending on clinical course) in in 3-4 days   Time spent: 60 minutes Tye Savoy  NP [facility] Pager [phone]

## 2015-05-20 NOTE — ED Notes (Signed)
Pt voids 200 cc to urinal. 

## 2015-05-20 NOTE — ED Notes (Signed)
We to dry dressing applied as per md  Request

## 2015-05-20 NOTE — ED Notes (Signed)
Pt to ct 

## 2015-05-20 NOTE — ED Notes (Signed)
attmepted to call report

## 2015-05-20 NOTE — Progress Notes (Signed)
ANTIBIOTIC CONSULT NOTE - INITIAL  Pharmacy Consult for vanc/zosyn Indication: rule out sepsis  No Known Allergies  Patient Measurements: Height: 5\' 10"  (177.8 cm) Weight: 188 lb (85.276 kg) IBW/kg (Calculated) : 73 Adjusted Body Weight:   Vital Signs: Temp: 99.1 F (37.3 C) (01/05 0944) Temp Source: Rectal (01/05 0944) BP: 154/69 mmHg (01/05 1217) Pulse Rate: 74 (01/05 1217) Intake/Output from previous day:   Intake/Output from this shift: Total I/O In: 1550 [I.V.:1550] Out: -   Labs:  Recent Labs  05/20/15 0940 05/20/15 1009  WBC 14.6*  --   HGB 11.9* 14.3  PLT 260  --   CREATININE 1.20 1.00   Estimated Creatinine Clearance: 45.6 mL/min (by C-G formula based on Cr of 1). No results for input(s): VANCOTROUGH, VANCOPEAK, VANCORANDOM, GENTTROUGH, GENTPEAK, GENTRANDOM, TOBRATROUGH, TOBRAPEAK, TOBRARND, AMIKACINPEAK, AMIKACINTROU, AMIKACIN in the last 72 hours.   Microbiology: No results found for this or any previous visit (from the past 720 hour(s)).  Medical History: Past Medical History  Diagnosis Date  . Arthritis   . Cancer ([place])     bladder  . Hypertension   . S/P radiation therapy 02/15/2015 through 03/18/2015    Left pretibial region 4400 cGy in 10 sessions, two sessions a week for 5 weeks     Assessment: 89 yom with CC of fall. Pharmacy consulted to dose vanc/zosyn for r/o sepsis. Afeb, wbc 14.6 on admit. SCr 1 on admit, CrCl~45.  1/5 vanc>> 1/5 zosyn>>  1/5 BCx2>>  Goal of Therapy:  Vancomycin trough level 15-20 mcg/ml  Plan:  Vanc 1250mg  IV x1 dose; then Vanc 1250mg  IV q24h Zosyn 33min inf x1; then Zosyn 3.375g IV q8h Monitor clinical progress, c/s, renal function, abx plan/LOT VT@SS  as indicated  Elicia Lamp, PharmD, [facility] Clinical Pharmacist Pager [phone] 05/20/2015 12:50 PM

## 2015-05-20 NOTE — ED Notes (Signed)
Attempted to call report

## 2015-05-20 NOTE — Consult Note (Addendum)
Reason for Consult:right greater troch avulsion Referring Physician: Barbaraann Faster MD  [name] [name] is an 82 y.o. male.  HPI: 82 yo uses walker for transfers minimal household ambulator, here with daughter. Pt fell when bedside toilet collapsed. Could not get up.  xrays and CT shows greater troch avulsion without IT nor Femoral neck fracture.    Past Medical History  Diagnosis Date  . Arthritis   . Cancer ([place])     bladder  . Hypertension   . S/P radiation therapy 02/15/2015 through 03/18/2015    Left pretibial region 4400 cGy in 10 sessions, two sessions a week for 5 weeks   . PAD (peripheral artery disease) ([place])   . PVD (peripheral vascular disease) [facility])     Past Surgical History  Procedure Laterality Date  . Achilles tendon surgery      Family History  Problem Relation Age of Onset  . Arthritis Mother   . Arthritis Mother   . Hearing loss Father     Social History:  reports that he has been smoking Pipe.  He has never used smokeless tobacco. He reports that he drinks about 1.2 oz of alcohol per week. He reports that he does not use illicit drugs.  Allergies: No Known Allergies  Medications: I have reviewed the patient's current medications.  Results for orders placed or performed during the hospital encounter of 05/20/15 (from the past 48 hour(s))  CBC with Differential     Status: Abnormal   Collection Time: 05/20/15  9:40 AM  Result Value Ref Range   WBC 14.6 (H) 4.0 - 10.5 K/uL   RBC 3.74 (L) 4.22 - 5.81 MIL/uL   Hemoglobin 11.9 (L) 13.0 - 17.0 g/dL   HCT 36.8 (L) 39.0 - 52.0 %   MCV 98.4 78.0 - 100.0 fL   MCH 31.8 26.0 - 34.0 pg   MCHC 32.3 30.0 - 36.0 g/dL   RDW 14.2 11.5 - 15.5 %   Platelets 260 150 - 400 K/uL   Neutrophils Relative % 83 %   Neutro Abs 12.1 (H) 1.7 - 7.7 K/uL   Lymphocytes Relative 11 %   Lymphs Abs 1.6 0.7 - 4.0 K/uL   Monocytes Relative 6 %   Monocytes Absolute 0.8 0.1 - 1.0 K/uL   Eosinophils Relative 0 %    Eosinophils Absolute 0.0 0.0 - 0.7 K/uL   Basophils Relative 0 %   Basophils Absolute 0.0 0.0 - 0.1 K/uL  Comprehensive metabolic panel     Status: Abnormal   Collection Time: 05/20/15  9:40 AM  Result Value Ref Range   Sodium 137 135 - 145 mmol/L   Potassium 4.5 3.5 - 5.1 mmol/L   Chloride 106 101 - 111 mmol/L   CO2 19 (L) 22 - 32 mmol/L   Glucose, Bld 138 (H) 65 - 99 mg/dL   BUN 30 (H) 6 - 20 mg/dL   Creatinine, Ser 1.20 0.61 - 1.24 mg/dL   Calcium 9.4 8.9 - 10.3 mg/dL   Total Protein 6.9 6.5 - 8.1 g/dL   Albumin 3.5 3.5 - 5.0 g/dL   AST 23 15 - 41 U/L   ALT 14 (L) 17 - 63 U/L   Alkaline Phosphatase 70 38 - 126 U/L   Total Bilirubin 0.8 0.3 - 1.2 mg/dL   GFR calc non Af Amer 50 (L) >60 mL/min   GFR calc Af Amer 57 (L) >60 mL/min    Comment: (NOTE) The eGFR has been calculated using the CKD EPI equation.  This calculation has not been validated in all clinical situations. eGFR's persistently <60 mL/min signify possible Chronic Kidney Disease.    Anion gap 12 5 - 15  Protime-INR     Status: Abnormal   Collection Time: 05/20/15  9:40 AM  Result Value Ref Range   Prothrombin Time 15.5 (H) 11.6 - 15.2 seconds   INR 1.21 0.00 - 1.49  APTT     Status: None   Collection Time: 05/20/15  9:40 AM  Result Value Ref Range   aPTT 30 24 - 37 seconds  CK     Status: None   Collection Time: 05/20/15  9:40 AM  Result Value Ref Range   Total CK 69 49 - 397 U/L  I-Stat CG4 Lactic Acid, ED     Status: Abnormal   Collection Time: 05/20/15 10:09 AM  Result Value Ref Range   Lactic Acid, Venous 4.20 (HH) 0.5 - 2.0 mmol/L   Comment NOTIFIED PHYSICIAN   I-Stat Chem 8, ED     Status: Abnormal   Collection Time: 05/20/15 10:09 AM  Result Value Ref Range   Sodium 140 135 - 145 mmol/L   Potassium 4.3 3.5 - 5.1 mmol/L   Chloride 106 101 - 111 mmol/L   BUN 31 (H) 6 - 20 mg/dL   Creatinine, Ser 1.00 0.61 - 1.24 mg/dL   Glucose, Bld 136 (H) 65 - 99 mg/dL   Calcium, Ion 1.22 1.13 - 1.30 mmol/L    TCO2 20 0 - 100 mmol/L   Hemoglobin 14.3 13.0 - 17.0 g/dL   HCT 42.0 39.0 - 52.0 %  Urinalysis, Routine w reflex microscopic (not at [facility])     Status: None   Collection Time: 05/20/15 10:35 AM  Result Value Ref Range   Color, Urine YELLOW YELLOW   APPearance CLEAR CLEAR   Specific Gravity, Urine 1.018 1.005 - 1.030   pH 6.5 5.0 - 8.0   Glucose, UA NEGATIVE NEGATIVE mg/dL   Hgb urine dipstick NEGATIVE NEGATIVE   Bilirubin Urine NEGATIVE NEGATIVE   Ketones, ur NEGATIVE NEGATIVE mg/dL   Protein, ur NEGATIVE NEGATIVE mg/dL   Nitrite NEGATIVE NEGATIVE   Leukocytes, UA NEGATIVE NEGATIVE    Comment: MICROSCOPIC NOT DONE ON URINES WITH NEGATIVE PROTEIN, BLOOD, LEUKOCYTES, NITRITE, OR GLUCOSE <1000 mg/dL.  I-Stat Troponin, ED (not at [facility])     Status: None   Collection Time: 05/20/15 10:35 AM  Result Value Ref Range   Troponin i, poc 0.02 0.00 - 0.08 ng/mL   Comment 3            Comment: Due to the release kinetics of cTnI, a negative result within the first hours of the onset of symptoms does not rule out myocardial infarction with certainty. If myocardial infarction is still suspected, repeat the test at appropriate intervals.   I-Stat CG4 Lactic Acid, ED     Status: None   Collection Time: 05/20/15  1:13 PM  Result Value Ref Range   Lactic Acid, Venous 1.05 0.5 - 2.0 mmol/L  Lactic acid, plasma     Status: None   Collection Time: 05/20/15  1:55 PM  Result Value Ref Range   Lactic Acid, Venous 1.0 0.5 - 2.0 mmol/L  I-Stat CG4 Lactic Acid, ED     Status: None   Collection Time: 05/20/15  5:23 PM  Result Value Ref Range   Lactic Acid, Venous 0.93 0.5 - 2.0 mmol/L    Ct Head Wo Contrast  05/20/2015  CLINICAL DATA:  Fall on Plavix.  Found on floor.  Initial encounter. EXAM: CT HEAD WITHOUT CONTRAST CT CERVICAL SPINE WITHOUT CONTRAST TECHNIQUE: Multidetector CT imaging of the head and cervical spine was performed following the standard protocol without intravenous contrast.  Multiplanar CT image reconstructions of the cervical spine were also generated. COMPARISON:  None. FINDINGS: CT HEAD FINDINGS Skull and Sinuses:Negative for fracture. There is subtle deep soft tissue thickening at the vertex, not convincing for acute contusion. Bilateral mild chronic mastoiditis with sclerosis and opacification in the mastoid tips. Visualized orbits: No traumatic finding.  Right cataract resection Brain: No evidence of acute infarction, hemorrhage, hydrocephalus, or mass lesion/mass effect. Age congruent generalized volume loss and small vessel ischemic change in the deep cerebral white matter. CT CERVICAL SPINE FINDINGS Negative for acute fracture or subluxation. No prevertebral edema. No gross cervical canal hematoma. Multilevel disc degeneration with occasional calcification. There is notable bulging disc at C4-5 with ventral cord contact but no suspected mass-effect. Biapical pleural based scarring. IMPRESSION: No evidence of intracranial or cervical spine injury. Electronically Signed   By: Monte Fantasia M.D.   On: 05/20/2015 10:17   Ct Cervical Spine Wo Contrast  05/20/2015  CLINICAL DATA:  Fall on Plavix.  Found on floor.  Initial encounter. EXAM: CT HEAD WITHOUT CONTRAST CT CERVICAL SPINE WITHOUT CONTRAST TECHNIQUE: Multidetector CT imaging of the head and cervical spine was performed following the standard protocol without intravenous contrast. Multiplanar CT image reconstructions of the cervical spine were also generated. COMPARISON:  None. FINDINGS: CT HEAD FINDINGS Skull and Sinuses:Negative for fracture. There is subtle deep soft tissue thickening at the vertex, not convincing for acute contusion. Bilateral mild chronic mastoiditis with sclerosis and opacification in the mastoid tips. Visualized orbits: No traumatic finding.  Right cataract resection Brain: No evidence of acute infarction, hemorrhage, hydrocephalus, or mass lesion/mass effect. Age congruent generalized volume loss  and small vessel ischemic change in the deep cerebral white matter. CT CERVICAL SPINE FINDINGS Negative for acute fracture or subluxation. No prevertebral edema. No gross cervical canal hematoma. Multilevel disc degeneration with occasional calcification. There is notable bulging disc at C4-5 with ventral cord contact but no suspected mass-effect. Biapical pleural based scarring. IMPRESSION: No evidence of intracranial or cervical spine injury. Electronically Signed   By: Monte Fantasia M.D.   On: 05/20/2015 10:17   Dg Pelvis Portable  05/20/2015  CLINICAL DATA:  Status post fall this morning. EXAM: PORTABLE PELVIS 1-2 VIEWS COMPARISON:  None. FINDINGS: There is an isolated right greater trochanteric fracture. There is no other fracture or dislocation. 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 IMPRESSION: Isolated right greater trochanteric fracture on a single AP view. If there is further clinical concern, recommend dedicated right hip views or a CT of the right hip. Electronically Signed   By: Kathreen Devoid   On: 05/20/2015 10:41   Ct Hip Right Wo Contrast  05/20/2015  CLINICAL DATA:  Patient found down today. Right hip pain and greater trochanter fracture by plain films. Initial encounter. EXAM: CT OF THE RIGHT HIP WITHOUT CONTRAST TECHNIQUE: Multidetector CT imaging of the right hip was performed according to the standard protocol. Multiplanar CT image reconstructions were also generated. COMPARISON:  Title of the right hip earlier today. FINDINGS: As seen on the plain films earlier today, there is a fracture of the greater trochanter with approximately 2 cm of superior distraction. No intertrochanteric extension is identified although bones are somewhat osteopenic. The right hip is located with mild degenerative change seen.  Chondrocalcinosis about the right hip and symphysis pubis is noted. Imaged intrapelvic contents demonstrate no focal abnormality. Atherosclerotic vascular disease is noted.  IMPRESSION: Right greater trochanter fracture with approximately 2 cm of superior distraction. No intertrochanteric extension is identified but the patient is osteopenic and fractures can be occult on CT scan. MRI without contrast is a more sensitive test. Electronically Signed   By: Inge Rise M.D.   On: 05/20/2015 16:08   Dg Chest Portable 1 View  05/20/2015  CLINICAL DATA:  Status post fall.  Sore. EXAM: PORTABLE CHEST 1 VIEW COMPARISON:  08/22/2013 FINDINGS: There is bilateral chronic interstitial thickening. There is no focal consolidation. There is no pleural effusion or pneumothorax. The heart and mediastinal contours are unremarkable. There is thoracic aortic atherosclerosis. There is a small hiatal hernia. The osseous structures are unremarkable. IMPRESSION: No active disease. Electronically Signed   By: Kathreen Devoid   On: 05/20/2015 10:45   Dg Hip Unilat With Pelvis 2-3 Views Right  05/20/2015  CLINICAL DATA:  Fall this morning with limited range of motion in the right leg. Initial encounter. EXAM: DG HIP (WITH OR WITHOUT PELVIS) 2-3V RIGHT COMPARISON:  None. FINDINGS: Mildly distracted greater trochanter fracture of the right femur. There is no evidence of intertrochanteric extension, but MRI would be the most sensitive method for evaluation in this patient with marked osteopenia. No evidence of pelvic ring fracture. Located right hip. IMPRESSION: Mildly distracted greater trochanter fracture on the right. No intertrochanteric extension identified, but MRI would have the highest sensitivity for fracture characterization. Electronically Signed   By: Monte Fantasia M.D.   On: 05/20/2015 12:09    Review of Systems  Constitutional: Positive for weight loss. Negative for fever and chills.  HENT: Positive for hearing loss.   Cardiovascular: Positive for claudication.  Neurological: Negative for seizures.   Blood pressure 173/77, pulse 71, temperature 99.1 F (37.3 C), temperature source  Rectal, resp. rate 17, height '5\' 10"'$  (1.778 m), weight 85.276 kg (188 lb), SpO2 98 %. Physical Exam  Constitutional:  Thin, elderly, cachetic.   HENT:  Head: Atraumatic.  Right Ear: External ear normal.  Neck: Normal range of motion. Neck supple.  Cardiovascular: Normal rate.   Respiratory: Effort normal.  GI: Soft.  Musculoskeletal:  Pain with right hip ROM. Sensation intact foot.   Psychiatric: He has a normal mood and affect.    Assessment/Plan: Right greater troch Fx with slight 2cm displacement. Bone is soft and osteopenic by plain xrays. Not sure troch clamp would be able to hold soft bone. Should do OK with PT mobilization for transfers. Likely will need SNF for a few weeks of work on transfers. Will follow.  I ordered norco for pain and PT consult to work on transfers.       [place] C 05/20/2015, 7:37 PM

## 2015-05-20 NOTE — ED Provider Notes (Signed)
CSN: [id number]     Arrival date & time 05/20/15  0930 History   First MD Initiated Contact with Patient 05/20/15 [phone]     Chief Complaint  Patient presents with  . Fall   82 year old Caucasian male with PMH of HTN, chronic lower cavity wounds, bladder cancer, and arthritis who presents today by EMS after unwitnessed fall. Patient was found by home health nurse who read this morning to check on him. Was found by his bedside commode with the bedside commode tipped over. Patient is on Plavix. Patient unable to contribute to history of present illness. Endorses no focal pain anywhere.  (Consider location/radiation/quality/duration/timing/severity/associated sxs/prior Treatment) Patient is a 82 y.o. male presenting with fall.  Fall This is a new problem. The current episode started today (unknown time). Episode frequency: unknown. The problem has been unchanged. Pertinent negatives include no abdominal pain, chest pain, chills, fever, headaches, nausea, numbness, vomiting or weakness. Nothing aggravates the symptoms. He has tried nothing for the symptoms. The treatment provided no relief.    Past Medical History  Diagnosis Date  . Arthritis   . Cancer ([place])     bladder  . Hypertension   . S/P radiation therapy 02/15/2015 through 03/18/2015    Left pretibial region 4400 cGy in 10 sessions, two sessions a week for 5 weeks   . PAD (peripheral artery disease) ([place])   . PVD (peripheral vascular disease) [facility])    Past Surgical History  Procedure Laterality Date  . Achilles tendon surgery     Family History  Problem Relation Age of Onset  . Arthritis Mother   . Arthritis Mother   . Hearing loss Father    Social History  Substance Use Topics  . Smoking status: Current Some Day Smoker    Types: Pipe  . Smokeless tobacco: Never Used  . Alcohol Use: 1.2 oz/week    2 Glasses of wine per week    Review of Systems  Unable to perform ROS: Dementia  Constitutional:  Negative for fever and chills.  Respiratory: Negative for shortness of breath.   Cardiovascular: Negative for chest pain, palpitations and leg swelling.  Gastrointestinal: Negative for nausea, vomiting, abdominal pain, diarrhea, constipation and abdominal distention.  Genitourinary: Negative for dysuria, frequency, flank pain and decreased urine volume.  Neurological: Negative for dizziness, speech difficulty, weakness, light-headedness, numbness and headaches.  All other systems reviewed and are negative.     Allergies  Review of patient's allergies indicates no known allergies.  Home Medications   Prior to Admission medications   Medication Sig Start Date End Date Taking? Authorizing Provider  acetaminophen (TYLENOL) 500 MG tablet Take 1,000 mg by mouth every 4 (four) hours as needed for moderate pain.   Yes Historical Provider, MD  amLODipine-benazepril (LOTREL) 10-40 MG per capsule Take 1 capsule by mouth daily.   Yes Historical Provider, MD  brimonidine (ALPHAGAN) 0.15 % ophthalmic solution Place 1 drop into both eyes 2 (two) times daily.   Yes Historical Provider, MD  brinzolamide (AZOPT) 1 % ophthalmic suspension Place 1 drop into both eyes 3 (three) times daily.   Yes Historical Provider, MD  clopidogrel (PLAVIX) 75 MG tablet Take 75 mg by mouth daily with breakfast.   Yes Historical Provider, MD  finasteride (PROSCAR) 5 MG tablet Take 5 mg by mouth daily.   Yes Historical Provider, MD  hydrALAZINE (APRESOLINE) 10 MG tablet Take 5 mg by mouth 2 (two) times daily. TAKE 1/2 TABLET   Yes Historical Provider, MD  latanoprost (  XALATAN) 0.005 % ophthalmic solution Place 1 drop into both eyes at bedtime.   Yes Historical Provider, MD  MEGARED OMEGA-3 KRILL OIL 500 MG CAPS Take 1 tablet by mouth daily.   Yes Historical Provider, MD  Multiple Vitamins-Minerals (ICAPS AREDS 2 PO) Take 1 tablet by mouth.   Yes Historical Provider, MD  tamsulosin (FLOMAX) 0.4 MG CAPS capsule Take 0.4 mg by  mouth daily after supper.   Yes Historical Provider, MD  traMADol (ULTRAM) 50 MG tablet Take 50-100 mg by mouth 3 (three) times daily. 1 in the morning, 1 in the evening, and 2 at bedtime   Yes Historical Provider, MD   BP 154/69 mmHg  Pulse 74  Temp(Src) 99.1 F (37.3 C) (Rectal)  Resp 13  Ht 5\' 10"  (1.778 m)  Wt 85.276 kg  BMI 26.98 kg/m2  SpO2 97% Physical Exam  Constitutional: He appears well-developed and well-nourished. No distress.  HENT:  Head: Normocephalic and atraumatic.  Eyes: Pupils are equal, round, and reactive to light.  Neck: Normal range of motion.  Cardiovascular: Normal rate, regular rhythm, normal heart sounds and intact distal pulses.  Exam reveals no gallop and no friction rub.   No murmur heard. Pulmonary/Chest: Effort normal and breath sounds normal. No respiratory distress. He has no wheezes. He has no rales. He exhibits no tenderness.  Abdominal: Soft. Bowel sounds are normal. He exhibits no distension and no mass. There is no tenderness. There is no rebound and no guarding.  Musculoskeletal: Normal range of motion. He exhibits tenderness (right hip).  Lymphadenopathy:    He has no cervical adenopathy.  Neurological: He is alert. No cranial nerve deficit. Coordination normal.  Oriented to person, hard of hearing  Skin: Skin is warm and dry. He is not diaphoretic.  LEs with chronic wounds, bandaged.  Nursing note and vitals reviewed.   ED Course  Procedures (including critical care time) Labs Review Labs Reviewed  CBC WITH DIFFERENTIAL/PLATELET - Abnormal; Notable for the following:    WBC 14.6 (*)    RBC 3.74 (*)    Hemoglobin 11.9 (*)    HCT 36.8 (*)    Neutro Abs 12.1 (*)    All other components within normal limits  COMPREHENSIVE METABOLIC PANEL - Abnormal; Notable for the following:    CO2 19 (*)    Glucose, Bld 138 (*)    BUN 30 (*)    ALT 14 (*)    GFR calc non Af Amer 50 (*)    GFR calc Af Amer 57 (*)    All other components within  normal limits  PROTIME-INR - Abnormal; Notable for the following:    Prothrombin Time 15.5 (*)    All other components within normal limits  I-STAT CG4 LACTIC ACID, ED - Abnormal; Notable for the following:    Lactic Acid, Venous 4.20 (*)    All other components within normal limits  I-STAT CHEM 8, ED - Abnormal; Notable for the following:    BUN 31 (*)    Glucose, Bld 136 (*)    All other components within normal limits  CULTURE, BLOOD (ROUTINE X 2)  CULTURE, BLOOD (ROUTINE X 2)  APTT  URINALYSIS, ROUTINE W REFLEX MICROSCOPIC (NOT AT [facility])  CK  LACTIC ACID, PLASMA  I-STAT TROPOININ, ED  I-STAT CG4 LACTIC ACID, ED  I-STAT CG4 LACTIC ACID, ED    Imaging Review Ct Head Wo Contrast  05/20/2015  CLINICAL DATA:  Fall on Plavix.  Found on floor.  Initial  encounter. EXAM: CT HEAD WITHOUT CONTRAST CT CERVICAL SPINE WITHOUT CONTRAST TECHNIQUE: Multidetector CT imaging of the head and cervical spine was performed following the standard protocol without intravenous contrast. Multiplanar CT image reconstructions of the cervical spine were also generated. COMPARISON:  None. FINDINGS: CT HEAD FINDINGS Skull and Sinuses:Negative for fracture. There is subtle deep soft tissue thickening at the vertex, not convincing for acute contusion. Bilateral mild chronic mastoiditis with sclerosis and opacification in the mastoid tips. Visualized orbits: No traumatic finding.  Right cataract resection Brain: No evidence of acute infarction, hemorrhage, hydrocephalus, or mass lesion/mass effect. Age congruent generalized volume loss and small vessel ischemic change in the deep cerebral white matter. CT CERVICAL SPINE FINDINGS Negative for acute fracture or subluxation. No prevertebral edema. No gross cervical canal hematoma. Multilevel disc degeneration with occasional calcification. There is notable bulging disc at C4-5 with ventral cord contact but no suspected mass-effect. Biapical pleural based scarring. IMPRESSION:  No evidence of intracranial or cervical spine injury. Electronically Signed   By: Monte Fantasia M.D.   On: 05/20/2015 10:17   Ct Cervical Spine Wo Contrast  05/20/2015  CLINICAL DATA:  Fall on Plavix.  Found on floor.  Initial encounter. EXAM: CT HEAD WITHOUT CONTRAST CT CERVICAL SPINE WITHOUT CONTRAST TECHNIQUE: Multidetector CT imaging of the head and cervical spine was performed following the standard protocol without intravenous contrast. Multiplanar CT image reconstructions of the cervical spine were also generated. COMPARISON:  None. FINDINGS: CT HEAD FINDINGS Skull and Sinuses:Negative for fracture. There is subtle deep soft tissue thickening at the vertex, not convincing for acute contusion. Bilateral mild chronic mastoiditis with sclerosis and opacification in the mastoid tips. Visualized orbits: No traumatic finding.  Right cataract resection Brain: No evidence of acute infarction, hemorrhage, hydrocephalus, or mass lesion/mass effect. Age congruent generalized volume loss and small vessel ischemic change in the deep cerebral white matter. CT CERVICAL SPINE FINDINGS Negative for acute fracture or subluxation. No prevertebral edema. No gross cervical canal hematoma. Multilevel disc degeneration with occasional calcification. There is notable bulging disc at C4-5 with ventral cord contact but no suspected mass-effect. Biapical pleural based scarring. IMPRESSION: No evidence of intracranial or cervical spine injury. Electronically Signed   By: Monte Fantasia M.D.   On: 05/20/2015 10:17   Dg Pelvis Portable  05/20/2015  CLINICAL DATA:  Status post fall this morning. EXAM: PORTABLE PELVIS 1-2 VIEWS COMPARISON:  None. FINDINGS: There is an isolated right greater trochanteric fracture. There is no other fracture or dislocation. 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 IMPRESSION: Isolated right greater trochanteric fracture on a single AP view. If there is further clinical concern, recommend  dedicated right hip views or a CT of the right hip. Electronically Signed   By: Kathreen Devoid   On: 05/20/2015 10:41   Dg Chest Portable 1 View  05/20/2015  CLINICAL DATA:  Status post fall.  Sore. EXAM: PORTABLE CHEST 1 VIEW COMPARISON:  08/22/2013 FINDINGS: There is bilateral chronic interstitial thickening. There is no focal consolidation. There is no pleural effusion or pneumothorax. The heart and mediastinal contours are unremarkable. There is thoracic aortic atherosclerosis. There is a small hiatal hernia. The osseous structures are unremarkable. IMPRESSION: No active disease. Electronically Signed   By: Kathreen Devoid   On: 05/20/2015 10:45   Dg Hip Unilat With Pelvis 2-3 Views Right  05/20/2015  CLINICAL DATA:  Fall this morning with limited range of motion in the right leg. Initial encounter. EXAM: DG HIP (WITH OR WITHOUT  PELVIS) 2-3V RIGHT COMPARISON:  None. FINDINGS: Mildly distracted greater trochanter fracture of the right femur. There is no evidence of intertrochanteric extension, but MRI would be the most sensitive method for evaluation in this patient with marked osteopenia. No evidence of pelvic ring fracture. Located right hip. IMPRESSION: Mildly distracted greater trochanter fracture on the right. No intertrochanteric extension identified, but MRI would have the highest sensitivity for fracture characterization. Electronically Signed   By: Monte Fantasia M.D.   On: 05/20/2015 12:09   I have personally reviewed and evaluated these images and lab results as part of my medical decision-making.   EKG Interpretation None      ED ECG REPORT   Date: 05/20/2015  Rate: 70  Rhythm: normal sinus rhythm  QRS Axis: left  Intervals: normal  ST/T Wave abnormalities: nonspecific ST changes  Conduction Disutrbances:possible left fascicular block  Narrative Interpretation: Sinus rhythm, LAD, consider L ant fasc block, probable old anteroseptal infarct  Old EKG Reviewed: unchanged  I have  personally reviewed the EKG tracing and agree with the computerized printout as noted.   MDM   Final diagnoses:  Pain  Closed right hip fracture, initial encounter ([place])  Fall at home, initial encounter  Lactic acidosis   82 year old Caucasian male here after fall, on Plavix. See history of present illness for details. On exam patient in NAD, afebrile, vital signs stable. Unknown circumstances about fall: syncope vs mechanical? Unknown length of time down. Work up for injuries, infection, and rhabdo. Given IVFs.   10:17 AM Pt now seems more oriented. Able to tell us that he slipped while putting the lid down on the commode at 6AM. (Pt gets up every day at 6AM.) After he fell, was unable to get up as floor was slippery from urine. He denies LOC.  CT Head: wnl CT Cspine: wnl CXR: wnl XR pelvis: Right tronchanteric fx. -->obtained dedicated XR right hip.  XR R Hip: Right greater trochanteric fracture. Consulted ortho-->recommend CT hip to further evaluate.  WBC count elevated, Lactic acid >4. Concern for infxn. Given 30cc/kg IVFs. Workup reveals no obvious source. Covered w/broad spec Abx. Repeat Lactic acid now 1.  Cr good, unlikely rhabdo.  Admit to hospital for further treatment.    Pt was seen under the supervision of Dr. Stark Jock.     Sherian Maroon, MD 05/20/15 [place], MD 05/20/15 [place], MD 05/20/15 [phone]

## 2015-05-20 NOTE — ED Notes (Signed)
Pt given po fluids. 

## 2015-05-20 NOTE — ED Notes (Signed)
To ED via [facility] from home with c/o fell during the night-- was found on floor by home health aid. Pt is yelling on arrival-- when pt is moved to ED stretcher-- able to move all extremities, answers questions appropriately, oriented x 4, C-Collar placed, removed from backboard per Dr. Tamala Julian.

## 2015-05-20 NOTE — ED Notes (Signed)
Pt voids to urinal but misses for small amount. Linen changed.

## 2015-05-21 LAB — CBC
HEMATOCRIT: 37.1 % — AB (ref 39.0–52.0)
Hemoglobin: 12 g/dL — ABNORMAL LOW (ref 13.0–17.0)
MCH: 31.7 pg (ref 26.0–34.0)
MCHC: 32.3 g/dL (ref 30.0–36.0)
MCV: 97.9 fL (ref 78.0–100.0)
Platelets: 270 10*3/uL (ref 150–400)
RBC: 3.79 MIL/uL — AB (ref 4.22–5.81)
RDW: 14.1 % (ref 11.5–15.5)
WBC: 11.9 10*3/uL — AB (ref 4.0–10.5)

## 2015-05-21 LAB — BASIC METABOLIC PANEL
ANION GAP: 7 (ref 5–15)
BUN: 16 mg/dL (ref 6–20)
CO2: 25 mmol/L (ref 22–32)
Calcium: 9.3 mg/dL (ref 8.9–10.3)
Chloride: 107 mmol/L (ref 101–111)
Creatinine, Ser: 0.84 mg/dL (ref 0.61–1.24)
GFR calc Af Amer: >60 mL/min (ref >60)
GLUCOSE: 118 mg/dL — AB (ref 65–99)
POTASSIUM: 4.4 mmol/L (ref 3.5–5.1)
Sodium: 139 mmol/L (ref 135–145)

## 2015-05-21 LAB — MRSA PCR SCREENING: MRSA by PCR: POSITIVE — AB

## 2015-05-21 MED ORDER — ENSURE ENLIVE PO LIQD
237.0000 mL | Freq: Three times a day (TID) | ORAL | Status: DC
  Administered 2015-05-21 – 2015-05-24 (×3): 237 mL via ORAL

## 2015-05-21 MED ORDER — CHLORHEXIDINE GLUCONATE CLOTH 2 % EX PADS
6.0000 | MEDICATED_PAD | Freq: Every day | CUTANEOUS | Status: DC
  Administered 2015-05-21 – 2015-05-24 (×4): 6 via TOPICAL

## 2015-05-21 MED ORDER — HALOPERIDOL 1 MG PO TABS
1.0000 mg | ORAL_TABLET | Freq: Three times a day (TID) | ORAL | Status: DC | PRN
  Filled 2015-05-21: qty 1

## 2015-05-21 MED ORDER — ACETAMINOPHEN 325 MG PO TABS
650.0000 mg | ORAL_TABLET | Freq: Four times a day (QID) | ORAL | Status: DC | PRN
  Administered 2015-05-21 – 2015-05-23 (×2): 650 mg via ORAL
  Filled 2015-05-21 (×2): qty 2

## 2015-05-21 MED ORDER — TRAMADOL HCL 50 MG PO TABS
50.0000 mg | ORAL_TABLET | Freq: Four times a day (QID) | ORAL | Status: DC | PRN
  Administered 2015-05-21 – 2015-05-23 (×2): 50 mg via ORAL
  Filled 2015-05-21 (×2): qty 1

## 2015-05-21 MED ORDER — MORPHINE SULFATE (PF) 2 MG/ML IV SOLN
0.5000 mg | INTRAVENOUS | Status: DC | PRN
  Administered 2015-05-21: 0.5 mg via INTRAVENOUS

## 2015-05-21 MED ORDER — MORPHINE SULFATE (PF) 2 MG/ML IV SOLN
INTRAVENOUS | Status: AC
  Administered 2015-05-21: 0.5 mg via INTRAVENOUS
  Filled 2015-05-21: qty 1

## 2015-05-21 MED ORDER — HALOPERIDOL LACTATE 5 MG/ML IJ SOLN
2.0000 mg | Freq: Three times a day (TID) | INTRAMUSCULAR | Status: DC | PRN
  Administered 2015-05-21: 2 mg via INTRAMUSCULAR
  Filled 2015-05-21: qty 1

## 2015-05-21 MED ORDER — MUPIROCIN 2 % EX OINT
1.0000 "application " | TOPICAL_OINTMENT | Freq: Two times a day (BID) | CUTANEOUS | Status: DC
  Administered 2015-05-21 – 2015-05-24 (×8): 1 via NASAL
  Filled 2015-05-21 (×2): qty 22

## 2015-05-21 NOTE — Progress Notes (Signed)
Initial Nutrition Assessment  DOCUMENTATION CODES:   Non-severe (moderate) malnutrition in context of chronic illness  INTERVENTION:    Ensure Enlive PO TID, each supplement provides 350 kcal and 20 grams of protein  Recommend liberalize diet to regular  NUTRITION DIAGNOSIS:   Malnutrition related to social / environmental circumstances, chronic illness as evidenced by mild depletion of body fat, mild depletion of muscle mass.  GOAL:   Patient will meet greater than or equal to 90% of their needs  MONITOR:   PO intake, Supplement acceptance  REASON FOR ASSESSMENT:   Consult Hip fracture protocol  ASSESSMENT:   82 y.o. male with a Past Medical History of bladder cancer, basal cell cancer of the left lower extremity, severe PAD, PVD, HTN who presents with right trochanteric fracture and lactic acidosis. No plans for surgery at this time.  Spoke with patient's daughter regarding nutrition hx. He typically eats very well, has a good appetite and eats ~4 meals per day. Nutrition-Focused physical exam completed. Findings are mild-moderate fat depletion and mild-moderate muscle depletion.   Diet Order:  Diet Heart Room service appropriate?: Yes; Fluid consistency:: Thin  Skin:  Wound (see comment) (skin tear, cracking to bil LE; 2 round wounds on leg)  Last BM:  unknown  Height:   Ht Readings from Last 1 Encounters:  05/20/15 5\' 10"  (1.778 m)    Weight:   Wt Readings from Last 1 Encounters:  05/20/15 175 lb 14.8 oz (79.8 kg)    Ideal Body Weight:  75.5 kg  BMI:  Body mass index is 25.24 kg/(m^2).  Estimated Nutritional Needs:   Kcal:  1900-2100  Protein:  95-110 gm  Fluid:  2 L  EDUCATION NEEDS:   No education needs identified at this time  Molli Barrows, [place], [place], [place] Pager [phone] After Hours Pager [phone]

## 2015-05-21 NOTE — Evaluation (Signed)
Physical Therapy Evaluation Patient Details Name: [name] MRN: [id number] DOB: Dec 08, 1917 Today's Date: 05/21/2015   History of Present Illness  Pt is a 82 y/o M admitter after fall and resultant Rt trochanteric fx and lactic acidosis.  Pt's PMH includes cancer, PVD, PAD.  Clinical Impression  Pt admitted with above diagnosis. Pt currently with functional limitations due to the deficits listed below (see PT Problem List). Mr. Lenon currently requires +2 mod>max assist for bed mobility.  Unclear level of assist avaialble at home, pt reports he lives at home alone but is a poor historian (chart review indicates that [facility] aide found pt after he had fallen).  Pt will need SNF at d/c to meet pt's current needs for assist. Pt will benefit from skilled PT to increase their independence and safety with mobility to allow discharge to the venue listed below.      Follow Up Recommendations SNF;Supervision/Assistance - 24 hour    Equipment Recommendations  Other (comment) (TBD at next venue of care)    Recommendations for Other Services       Precautions / Restrictions Precautions Precautions: Fall Restrictions Weight Bearing Restrictions: Yes RLE Weight Bearing: Weight bearing as tolerated (per conversation w/ Dr. Lorin Mercy)      Mobility  Bed Mobility Overal bed mobility: Needs Assistance;+2 for physical assistance Bed Mobility: Supine to Sit;Sit to Supine     Supine to sit: Mod assist;+2 for physical assistance;HOB elevated Sit to supine: +2 for physical assistance;Max assist   General bed mobility comments: Assist managing Bil LEs and cues for sequencing and technique.  Use of bed pad for positioning of pelvis.  Transfers                 General transfer comment: did not attempt for pt/therapist safety; pt refusing  Ambulation/Gait                Stairs            Wheelchair Mobility    Modified Rankin (Stroke Patients Only)       Balance  Overall balance assessment: Needs assistance;History of Falls Sitting-balance support: Bilateral upper extremity supported;Feet supported Sitting balance-Leahy Scale: Poor Sitting balance - Comments: Min guard>max assist.  Sat EOB ~10 minutes.  Requires cues for upright posture. Postural control: Posterior lean                                   Pertinent Vitals/Pain Pain Assessment: Faces Faces Pain Scale: Hurts worst Pain Location: back, Bil feet, Bil hips Pain Descriptors / Indicators: Grimacing;Moaning (yelling) Pain Intervention(s): Limited activity within patient's tolerance;Monitored during session;Repositioned    Home Living Family/patient expects to be discharged to:: Private residence Living Arrangements: Alone Available Help at Discharge: Family (unclear when avaialble to assist) Type of Home: House         Home Equipment: Walker - 2 wheels ("I use a walker at home") Additional Comments: Pt is a poor historian and unable to provide all of home living and PLOF information.  He says he lives alone and is unable to report how often someone is there (family or aide)    Prior Function Level of Independence: Needs assistance   Gait / Transfers Assistance Needed: unclear; pt reports he was using walker at home     Comments: Per pt he has an aide that comes to his home for a few hours in the morning and  a few hours in the afternoon.  When asked what the aide assists w/ he replies, "it's complicated"     Hand Dominance        Extremity/Trunk Assessment   Upper Extremity Assessment: Overall WFL for tasks assessed           Lower Extremity Assessment: RLE deficits/detail RLE Deficits / Details: limited ROM and strength s/p Rt greater troch avulsion    Cervical / Trunk Assessment: Kyphotic  Communication   Communication: HOH (has hearing aids)  Cognition Arousal/Alertness: Awake/alert Behavior During Therapy: Anxious Overall Cognitive Status:  History of cognitive impairments - at baseline                      General Comments General comments (skin integrity, edema, etc.): Notified RN of pt's need for soft boots for heel protection to prevent heel ulcer development.    Exercises General Exercises - Lower Extremity Long Arc Quad: AROM;Both;5 reps;Seated      Assessment/Plan    PT Assessment Patient needs continued PT services  PT Diagnosis Difficulty walking;Acute pain;Altered mental status   PT Problem List Decreased strength;Decreased range of motion;Decreased activity tolerance;Decreased balance;Decreased mobility;Decreased cognition;Decreased knowledge of use of DME;Decreased safety awareness;Pain  PT Treatment Interventions DME instruction;Gait training;Functional mobility training;Therapeutic activities;Therapeutic exercise;Balance training;Neuromuscular re-education;Cognitive remediation;Patient/family education;Wheelchair mobility training;Modalities   PT Goals (Current goals can be found in the Care Plan section) Acute Rehab PT Goals Patient Stated Goal: none stated PT Goal Formulation: Patient unable to participate in goal setting Time For Goal Achievement: 06/04/15 Potential to Achieve Goals: Good    Frequency Min 2X/week   Barriers to discharge   Unclear amount of assist available at home    Co-evaluation               End of Session   Activity Tolerance: Patient limited by pain Patient left: in bed;with call bell/phone within reach;with bed alarm set Nurse Communication: Mobility status;Other (comment);Need for lift equipment;Weight bearing status (see general comments)         Time: 1346-1410 PT Time Calculation (min) (ACUTE ONLY): 24 min   Charges:   PT Evaluation $PT Eval Moderate Complexity: 1 Procedure PT Treatments $Therapeutic Activity: 8-22 mins   PT G Codes:       Joslyn Hy PT, DPT [phone] Pager: [phone] 05/21/2015, 3:40 PM

## 2015-05-21 NOTE — Progress Notes (Signed)
Pt transferred to 6E09 via bed. Pt is alert and oriented to self only with pleasant affect. Bed in lowest position. Bed alarm initiated, call bell within reach. Will continue to monitor. Dorthey Sawyer, RN

## 2015-05-21 NOTE — Progress Notes (Addendum)
PROGRESS NOTE  [name] [id number] DOB: [birth year] DOA: 05/20/2015 PCP: [name], MD  Assessment/Plan: Right trocanteric fracture post fall at home this am. Ortho called by EDP -seen by Dr. Lorin Mercy -no surgery- recommends to do PT for transfers and SNF placement  Leukocytosis / elevated lactic acid. Lower leg wounds may be responsible. Doesn't meet sepsis criteria despite elevated lactic acid. He has received 2 liters of IVF in ED.  -blood and urine culture- clean so far -monitor lactic acid levels.  -CXR without acute findings -no antibiotics for now, leg wounds don't look that bad. U/A and CXR unremarkable. Will obtain blood and urine cultures.  -trend CBC  PAD / PVD with chronic (years) lower extremity ulcers. Declined surgical intervention. Recently started going to wound care in [facility] evaluation  Left anterior tibial basal cell carcinoma, s/p radiation  Patient lives at home and has 5 hours of care giving a day-- 3 in AM and 2 in PM  May transfer to floor bed later today  Code Status: partial Family Communication: daughter--- she will bring in glasses and hearing aids Disposition Plan: will need SNF   Consultants:  ortho  Procedures:      HPI/Subjective: VERY hard of hearing -asking "what is that in the corner" -wear mittens  Objective: Filed Vitals:   05/21/15 0349 05/21/15 0733  BP: 167/81 154/80  Pulse: 64 68  Temp: 97.6 F (36.4 C) 97.5 F (36.4 C)  Resp: 16 12    Intake/Output Summary (Last 24 hours) at 05/21/15 1010 Last data filed at 05/21/15 0735  Gross per 24 hour  Intake   1550 ml  Output   1450 ml  Net    100 ml   Filed Weights   05/20/15 0944 05/20/15 2035  Weight: 85.276 kg (188 lb) 79.8 kg (175 lb 14.8 oz)    Exam:   General:  Awake, VERY hard of hearing  Cardiovascular: rrr  Respiratory: clear  Abdomen: +BS, soft  Musculoskeletal: mittens on hands   Data Reviewed: Basic Metabolic  Panel:  Recent Labs Lab 05/20/15 0940 05/20/15 1009 05/21/15 0700  NA 137 140 139  K 4.5 4.3 4.4  CL 106 106 107  CO2 19*  --  25  GLUCOSE 138* 136* 118*  BUN 30* 31* 16  CREATININE 1.20 1.00 0.84  CALCIUM 9.4  --  9.3   Liver Function Tests:  Recent Labs Lab 05/20/15 0940  AST 23  ALT 14*  ALKPHOS 70  BILITOT 0.8  PROT 6.9  ALBUMIN 3.5   No results for input(s): LIPASE, AMYLASE in the last 168 hours. No results for input(s): AMMONIA in the last 168 hours. CBC:  Recent Labs Lab 05/20/15 0940 05/20/15 1009 05/21/15 0700  WBC 14.6*  --  11.9*  NEUTROABS 12.1*  --   --   HGB 11.9* 14.3 12.0*  HCT 36.8* 42.0 37.1*  MCV 98.4  --  97.9  PLT 260  --  270   Cardiac Enzymes:  Recent Labs Lab 05/20/15 0940  CKTOTAL 69   BNP (last 3 results) No results for input(s): BNP in the last 8760 hours.  ProBNP (last 3 results) No results for input(s): PROBNP in the last 8760 hours.  CBG: No results for input(s): GLUCAP in the last 168 hours.  Recent Results (from the past 240 hour(s))  MRSA PCR Screening     Status: Abnormal   Collection Time: 05/20/15 11:48 PM  Result Value Ref Range Status   MRSA by PCR  POSITIVE (A) NEGATIVE Final    Comment:        The GeneXpert MRSA Assay (FDA approved for NASAL specimens only), is one component of a comprehensive MRSA colonization surveillance program. It is not intended to diagnose MRSA infection nor to guide or monitor treatment for MRSA infections. RESULT CALLED TO, READ BACK BY AND VERIFIED WITH: C MOSELEY @0219  05/21/15 MKELLY      Studies: Ct Head Wo Contrast  05/20/2015  CLINICAL DATA:  Fall on Plavix.  Found on floor.  Initial encounter. EXAM: CT HEAD WITHOUT CONTRAST CT CERVICAL SPINE WITHOUT CONTRAST TECHNIQUE: Multidetector CT imaging of the head and cervical spine was performed following the standard protocol without intravenous contrast. Multiplanar CT image reconstructions of the cervical spine were also  generated. COMPARISON:  None. FINDINGS: CT HEAD FINDINGS Skull and Sinuses:Negative for fracture. There is subtle deep soft tissue thickening at the vertex, not convincing for acute contusion. Bilateral mild chronic mastoiditis with sclerosis and opacification in the mastoid tips. Visualized orbits: No traumatic finding.  Right cataract resection Brain: No evidence of acute infarction, hemorrhage, hydrocephalus, or mass lesion/mass effect. Age congruent generalized volume loss and small vessel ischemic change in the deep cerebral white matter. CT CERVICAL SPINE FINDINGS Negative for acute fracture or subluxation. No prevertebral edema. No gross cervical canal hematoma. Multilevel disc degeneration with occasional calcification. There is notable bulging disc at C4-5 with ventral cord contact but no suspected mass-effect. Biapical pleural based scarring. IMPRESSION: No evidence of intracranial or cervical spine injury. Electronically Signed   By: Monte Fantasia M.D.   On: 05/20/2015 10:17   Ct Cervical Spine Wo Contrast  05/20/2015  CLINICAL DATA:  Fall on Plavix.  Found on floor.  Initial encounter. EXAM: CT HEAD WITHOUT CONTRAST CT CERVICAL SPINE WITHOUT CONTRAST TECHNIQUE: Multidetector CT imaging of the head and cervical spine was performed following the standard protocol without intravenous contrast. Multiplanar CT image reconstructions of the cervical spine were also generated. COMPARISON:  None. FINDINGS: CT HEAD FINDINGS Skull and Sinuses:Negative for fracture. There is subtle deep soft tissue thickening at the vertex, not convincing for acute contusion. Bilateral mild chronic mastoiditis with sclerosis and opacification in the mastoid tips. Visualized orbits: No traumatic finding.  Right cataract resection Brain: No evidence of acute infarction, hemorrhage, hydrocephalus, or mass lesion/mass effect. Age congruent generalized volume loss and small vessel ischemic change in the deep cerebral white matter. CT  CERVICAL SPINE FINDINGS Negative for acute fracture or subluxation. No prevertebral edema. No gross cervical canal hematoma. Multilevel disc degeneration with occasional calcification. There is notable bulging disc at C4-5 with ventral cord contact but no suspected mass-effect. Biapical pleural based scarring. IMPRESSION: No evidence of intracranial or cervical spine injury. Electronically Signed   By: Monte Fantasia M.D.   On: 05/20/2015 10:17   Dg Pelvis Portable  05/20/2015  CLINICAL DATA:  Status post fall this morning. EXAM: PORTABLE PELVIS 1-2 VIEWS COMPARISON:  None. FINDINGS: There is an isolated right greater trochanteric fracture. There is no other fracture or dislocation. 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 IMPRESSION: Isolated right greater trochanteric fracture on a single AP view. If there is further clinical concern, recommend dedicated right hip views or a CT of the right hip. Electronically Signed   By: Kathreen Devoid   On: 05/20/2015 10:41   Ct Hip Right Wo Contrast  05/20/2015  CLINICAL DATA:  Patient found down today. Right hip pain and greater trochanter fracture by plain films. Initial encounter. EXAM:  CT OF THE RIGHT HIP WITHOUT CONTRAST TECHNIQUE: Multidetector CT imaging of the right hip was performed according to the standard protocol. Multiplanar CT image reconstructions were also generated. COMPARISON:  Title of the right hip earlier today. FINDINGS: As seen on the plain films earlier today, there is a fracture of the greater trochanter with approximately 2 cm of superior distraction. No intertrochanteric extension is identified although bones are somewhat osteopenic. The right hip is located with mild degenerative change seen. Chondrocalcinosis about the right hip and symphysis pubis is noted. Imaged intrapelvic contents demonstrate no focal abnormality. Atherosclerotic vascular disease is noted. IMPRESSION: Right greater trochanter fracture with approximately 2 cm of  superior distraction. No intertrochanteric extension is identified but the patient is osteopenic and fractures can be occult on CT scan. MRI without contrast is a more sensitive test. Electronically Signed   By: Inge Rise M.D.   On: 05/20/2015 16:08   Dg Chest Portable 1 View  05/20/2015  CLINICAL DATA:  Status post fall.  Sore. EXAM: PORTABLE CHEST 1 VIEW COMPARISON:  08/22/2013 FINDINGS: There is bilateral chronic interstitial thickening. There is no focal consolidation. There is no pleural effusion or pneumothorax. The heart and mediastinal contours are unremarkable. There is thoracic aortic atherosclerosis. There is a small hiatal hernia. The osseous structures are unremarkable. IMPRESSION: No active disease. Electronically Signed   By: Kathreen Devoid   On: 05/20/2015 10:45   Dg Hip Unilat With Pelvis 2-3 Views Right  05/20/2015  CLINICAL DATA:  Fall this morning with limited range of motion in the right leg. Initial encounter. EXAM: DG HIP (WITH OR WITHOUT PELVIS) 2-3V RIGHT COMPARISON:  None. FINDINGS: Mildly distracted greater trochanter fracture of the right femur. There is no evidence of intertrochanteric extension, but MRI would be the most sensitive method for evaluation in this patient with marked osteopenia. No evidence of pelvic ring fracture. Located right hip. IMPRESSION: Mildly distracted greater trochanter fracture on the right. No intertrochanteric extension identified, but MRI would have the highest sensitivity for fracture characterization. Electronically Signed   By: Monte Fantasia M.D.   On: 05/20/2015 12:09    Scheduled Meds: . amLODipine  10 mg Oral Daily   And  . benazepril  40 mg Oral Daily  . brimonidine  1 drop Both Eyes BID  . brinzolamide  1 drop Both Eyes TID  . Chlorhexidine Gluconate Cloth  6 each Topical Q0600  . enoxaparin (LOVENOX) injection  40 mg Subcutaneous Q24H  . finasteride  5 mg Oral Daily  . hydrALAZINE  5 mg Oral BID  . latanoprost  1 drop Both  Eyes QHS  . mupirocin ointment  1 application Nasal BID  . tamsulosin  0.4 mg Oral QPC supper   Continuous Infusions:  Antibiotics Given (last 72 hours)    None      Active Problems:   Leukocytosis   Trochanteric fracture of right femur (HCC)   Elevated lactic acid level   Hypertension   Fall   Hip fracture ([place])    Time spent: 35 min    [place] [facility] Pager [phone]. If 7PM-7AM, please contact night-coverage at [email or web address], password [facility] 05/21/2015, 10:10 AM  LOS: 1 day

## 2015-05-21 NOTE — Progress Notes (Signed)
Utilization Review Completed.Sirus Labrie T1/10/2015  

## 2015-05-21 NOTE — Consult Note (Signed)
WOC wound consult note Reason for Consult: LE ulcerations Patient is confused but is able to tell me his legs stay swollen. Appears his legs have been in compression.  Wound type: venous stasis ulcerations LLE, no open wounds on the right  Pressure Ulcer POA: No Measurement: Pretibial: 1cm x 1cm x 0.2cm; some thick yellow drainage; dark, yellow wound bed  Medial malleolus: 2.5cm x 2.0cm x 0.3cm and 2cm x 3cm x 0.2cm; yellow, thin drainage; wound bed clean, pink, moist  Wound bed: see above Drainage (amount, consistency, odor) see above Periwound: dry, venous dermatitis. Dressing procedure/placement/frequency: Add silver hydrofiber to the wounds for bioburdan, cover with dry dressing. Secure with kerlix.  I am not able to verify if the patient has compression therapy, patient reports he was not in wraps.  Keep legs elevated as much as possible.  Discussed POC with patient and bedside nurse.  Re consult if needed, will not follow at this time. Thanks  Zanai Mallari Kellogg, [place] [phone])

## 2015-05-22 DIAGNOSIS — S72009A Fracture of unspecified part of neck of unspecified femur, initial encounter for closed fracture: Secondary | ICD-10-CM

## 2015-05-22 DIAGNOSIS — I10 Essential (primary) hypertension: Secondary | ICD-10-CM

## 2015-05-22 DIAGNOSIS — E440 Moderate protein-calorie malnutrition: Secondary | ICD-10-CM | POA: Insufficient documentation

## 2015-05-22 DIAGNOSIS — D72829 Elevated white blood cell count, unspecified: Secondary | ICD-10-CM

## 2015-05-22 DIAGNOSIS — W19XXXS Unspecified fall, sequela: Secondary | ICD-10-CM

## 2015-05-22 DIAGNOSIS — E872 Acidosis: Secondary | ICD-10-CM

## 2015-05-22 LAB — BASIC METABOLIC PANEL
ANION GAP: 12 (ref 5–15)
BUN: 27 mg/dL — AB (ref 4–21)
BUN: 27 mg/dL — AB (ref 6–20)
CO2: 20 mmol/L — ABNORMAL LOW (ref 22–32)
CREATININE: 1.2 mg/dL (ref 0.6–1.3)
Calcium: 9.5 mg/dL (ref 8.9–10.3)
Chloride: 106 mmol/L (ref 101–111)
Creatinine, Ser: 1.21 mg/dL (ref 0.61–1.24)
GFR, EST AFRICAN AMERICAN: 57 mL/min — AB (ref >60)
GFR, EST NON AFRICAN AMERICAN: 49 mL/min — AB (ref >60)
Glucose, Bld: 111 mg/dL — ABNORMAL HIGH (ref 65–99)
Glucose: 111 mg/dL
POTASSIUM: 4.2 mmol/L (ref 3.5–5.1)
SODIUM: 138 mmol/L (ref 135–145)
Sodium: 138 mmol/L (ref 137–147)

## 2015-05-22 LAB — CBC
HCT: 36.9 % — ABNORMAL LOW (ref 39.0–52.0)
Hemoglobin: 12 g/dL — ABNORMAL LOW (ref 13.0–17.0)
MCH: 31.6 pg (ref 26.0–34.0)
MCHC: 32.5 g/dL (ref 30.0–36.0)
MCV: 97.1 fL (ref 78.0–100.0)
PLATELETS: 287 10*3/uL (ref 150–400)
RBC: 3.8 MIL/uL — AB (ref 4.22–5.81)
RDW: 14 % (ref 11.5–15.5)
WBC: 16.1 10*3/uL — AB (ref 4.0–10.5)

## 2015-05-22 LAB — CBC AND DIFFERENTIAL: WBC: 16.1 10^3/mL

## 2015-05-22 NOTE — Progress Notes (Signed)
Subjective:     Patient reports pain as mild.    Objective: Vital signs in last 24 hours: Temp:  [98 F (36.7 C)-99.4 F (37.4 C)] 99.4 F (37.4 C) (01/06 2027) Pulse Rate:  [72-96] 80 (01/07 0443) Resp:  [18-20] 20 (01/07 0443) BP: (131-143)/(69-79) 138/70 mmHg (01/07 0443) SpO2:  [99 %-100 %] 99 % (01/06 2027) Weight:  [78 kg (171 lb 15.3 oz)] 78 kg (171 lb 15.3 oz) (01/06 2027)  Intake/Output from previous day: 01/06 0701 - 01/07 0700 In: -  Out: 1600 [Urine:1600] Intake/Output this shift:     Recent Labs  05/20/15 0940 05/20/15 1009 05/21/15 0700 05/22/15 0448  HGB 11.9* 14.3 12.0* 12.0*    Recent Labs  05/21/15 0700 05/22/15 0448  WBC 11.9* 16.1*  RBC 3.79* 3.80*  HCT 37.1* 36.9*  PLT 270 287    Recent Labs  05/21/15 0700 05/22/15 0448  NA 139 138  K 4.4 4.2  CL 107 106  CO2 25 20*  BUN 16 27*  CREATININE 0.84 1.21  GLUCOSE 118* 111*  CALCIUM 9.3 9.5    Recent Labs  05/20/15 0940  INR 1.21    confused. hands restrained to bedrail.   Assessment/Plan:     Likely SNF.   Not a good candidate for chemical DVT prophylaxis with confusion, falls. Not following instructions   Etc.    SCD ordered.   Ashanna Heinsohn C 05/22/2015, 9:31 AM

## 2015-05-22 NOTE — NC FL2 (Signed)
[place] MEDICAID FL2 LEVEL OF CARE SCREENING TOOL     IDENTIFICATION  Patient Name: [name] [facility] Birthdate: May 20, 1917 Sex: male Admission Date (Current Location): 05/20/2015  [facility] and [place] Number:  Herbalist and Address:  [facility] [place], [place] [address], [place], [place] [address]      Provider Number: [id number]  Attending Physician Name and Address:  Verlee Monte, MD  Relative Name and Phone Number:       Current Level of Care: Hospital Recommended Level of Care: [place] Prior Approval Number:    Date Approved/Denied:   PASRR Number: [id number]  Discharge Plan: SNF    Current Diagnoses: Patient Active Problem List   Diagnosis Date Noted  . Trochanteric fracture of right femur ([place]) 05/20/2015  . Elevated lactic acid level 05/20/2015  . Hypertension 05/20/2015  . Fall 05/20/2015  . Hip fracture ([place]) 05/20/2015  . Closed right hip fracture ([place])   . Fall at home   . Lactic acidosis   . Basal cell carcinoma of pretibial region 01/21/2015  . FUO (fever of unknown origin) 08/22/2013  . Weakness 08/22/2013  . Leukocytosis 08/22/2013    Orientation RESPIRATION BLADDER Height & Weight    Self  Normal Incontinent, External catheter 5\' 10"  (177.8 cm) 171 lbs.  BEHAVIORAL SYMPTOMS/MOOD NEUROLOGICAL BOWEL NUTRITION STATUS      Incontinent Diet (Heart Healthy)  AMBULATORY STATUS COMMUNICATION OF NEEDS Skin   Extensive Assist Verbally Surgical wounds (left leg)                       Personal Care Assistance Level of Assistance  Bathing, Feeding, Dressing Bathing Assistance: Limited assistance Feeding assistance: Limited assistance Dressing Assistance: Limited assistance     Functional Limitations Info  Sight, Hearing, Speech Sight Info: Adequate Hearing Info: Adequate Speech Info: Adequate    SPECIAL CARE FACTORS FREQUENCY  PT (By licensed PT), OT (By licensed OT)     PT Frequency:  3 OT Frequency: 3            Contractures Contractures Info: Not present    Additional Factors Info  Code Status, Allergies, Isolation Precautions Code Status Info: Partial Code Allergies Info: No Known Allergies     Isolation Precautions Info: 05/20/15 MRSA by PCR     Current Medications (05/22/2015):  This is the current hospital active medication list Current Facility-Administered Medications  Medication Dose Route Frequency Provider Last Rate Last Dose  . acetaminophen (TYLENOL) tablet 650 mg  650 mg Oral Q6H PRN Geradine Girt, DO   650 mg at 05/21/15 1654  . amLODipine (NORVASC) tablet 10 mg  10 mg Oral Daily Waldemar Dickens, MD   10 mg at 05/22/15 0849   And  . benazepril (LOTENSIN) tablet 40 mg  40 mg Oral Daily Waldemar Dickens, MD   40 mg at 05/22/15 0849  . brimonidine (ALPHAGAN) 0.15 % ophthalmic solution 1 drop  1 drop Both Eyes BID Willia Craze, NP   1 drop at 05/22/15 0859  . brinzolamide (AZOPT) 1 % ophthalmic suspension 1 drop  1 drop Both Eyes TID Willia Craze, NP   1 drop at 05/22/15 0858  . Chlorhexidine Gluconate Cloth 2 % PADS 6 each  6 each Topical Q0600 Waldemar Dickens, MD   6 each at 05/22/15 [phone]  . enoxaparin (LOVENOX) injection 40 mg  40 mg Subcutaneous Q24H Willia Craze, NP   40 mg  at 05/20/15 2210  . feeding supplement (ENSURE ENLIVE) (ENSURE ENLIVE) liquid 237 mL  237 mL Oral TID BM Ardeen Garland, RD   237 mL at 05/22/15 1000  . finasteride (PROSCAR) tablet 5 mg  5 mg Oral Daily Willia Craze, NP   5 mg at 05/22/15 0849  . haloperidol (HALDOL) tablet 1 mg  1 mg Oral Q8H PRN Geradine Girt, DO       Or  . haloperidol lactate (HALDOL) injection 2 mg  2 mg Intramuscular Q8H PRN Geradine Girt, DO   2 mg at 05/21/15 1503  . hydrALAZINE (APRESOLINE) tablet 5 mg  5 mg Oral BID Willia Craze, NP   5 mg at 05/22/15 0849  . latanoprost (XALATAN) 0.005 % ophthalmic solution 1 drop  1 drop Both Eyes QHS Willia Craze, NP   1 drop at 05/20/15  2211  . morphine 2 MG/ML injection 0.5 mg  0.5 mg Intravenous Q4H PRN Geradine Girt, DO   0.5 mg at 05/21/15 1855  . mupirocin ointment (BACTROBAN) 2 % 1 application  1 application Nasal BID Waldemar Dickens, MD   1 application at [phone]  . polyethylene glycol (MIRALAX / GLYCOLAX) packet 17 g  17 g Oral Daily PRN Willia Craze, NP      . tamsulosin (FLOMAX) capsule 0.4 mg  0.4 mg Oral QPC supper Willia Craze, NP   0.4 mg at 05/21/15 1800  . traMADol (ULTRAM) tablet 50 mg  50 mg Oral Q6H PRN Geradine Girt, DO   50 mg at 05/21/15 1333     Discharge Medications: Please see discharge summary for a list of discharge medications.  Relevant Imaging Results:  Relevant Lab Results:   Additional [place], [place]

## 2015-05-22 NOTE — Care Management Note (Addendum)
Case Management Note  Patient Details  Name: [name] MRN: [id number] Date of Birth: [birth year]  Subjective/Objective: 82 yo M sustained a R trochanteric fx after falling.                           Action/Plan: received referral to assist pt with [facility] needs / long term acute care   Expected Discharge Date: 05/24/14                 Expected Discharge Plan:  [place]  In-House Referral:  Clinical Social Work  Discharge planning Services  CM Consult  Post Acute Care Choice:    Choice offered to:     DME Arranged:    DME Agency:     HH Arranged:    [place] Agency:     Status of Service:  In process, will continue to follow  Medicare Important Message Given:    Date Medicare IM Given:    Medicare IM give by:    Date Additional Medicare IM Given:    Additional Medicare Important Message give by:     If discussed at [place] of Stay Meetings, dates discussed:    Additional Comments: unable to discuss d/c plan with pt due to confusion. Contacted daughter at [phone], spoke with [name] who stated that she is the healthcare POA. She stated that pt's wife is her stepmother and she lives in an [facility] facility. Pt lives alone and she made some modifications to assist her dad. Pt has an aide for 3 hours in the morning and another aide in the evenings for 3 hrs. She takes him to his doctor's appointments. He was able to walk with a walker at home. Discussed PT recommendations for SNF and she agreed. Explained that a SW will assist with the SNF placement. The best # to contact her is her mobile # [phone].   Norina Buzzard, RN 05/22/2015, 11:31 AM

## 2015-05-22 NOTE — Progress Notes (Signed)
PROGRESS NOTE  [name] [id number] DOB: Feb 26, 1918 DOA: 05/20/2015 PCP: [name], MD   HPI/Subjective: Patient was confused this morning, also hard of hearing. Awaiting SNF bed.  Assessment/Plan:  Right trocanteric fracture post fall at home this am. Ortho called by EDP -CT scan showed no intertrochanteric extension, no hip fracture. -Seen by Dr. Lorin Mercy, commended no surgical intervention and SNF placement for PT.  Leukocytosis / elevated lactic acid. Lower leg wounds may be responsible. Doesn't meet sepsis criteria despite elevated lactic acid. He has received 2 liters of IVF in ED.  -blood and urine culture- clean so far -monitor lactic acid levels.  -CXR without acute findings -no antibiotics for now, leg wounds don't look that bad. U/A and CXR unremarkable. Will obtain blood and urine cultures.  -trend CBC  PAD / PVD with chronic (years) lower extremity ulcers. Declined surgical intervention. Recently started going to wound care in [facility] evaluation  Left anterior tibial basal cell carcinoma, s/p radiation Patient lives at home and has 5 hours of care giving a day-- 3 in AM and 2 in PM   Code Status: partial Family Communication: daughter--- she will bring in glasses and hearing aids Disposition Plan: will need SNF   Consultants:  ortho  Procedures:        Objective: Filed Vitals:   05/22/15 0443 05/22/15 1243  BP: 138/70 141/66  Pulse: 80 86  Temp:  99.5 F (37.5 C)  Resp: 20 20    Intake/Output Summary (Last 24 hours) at 05/22/15 1404 Last data filed at 05/22/15 0640  Gross per 24 hour  Intake      0 ml  Output    400 ml  Net   -400 ml   Filed Weights   05/20/15 0944 05/20/15 2035 05/21/15 2027  Weight: 85.276 kg (188 lb) 79.8 kg (175 lb 14.8 oz) 78 kg (171 lb 15.3 oz)    Exam:   General:  Awake, VERY hard of hearing  Cardiovascular: rrr  Respiratory: clear  Abdomen: +BS, soft  Musculoskeletal: mittens on  hands , feet with creased skin  Data Reviewed: Basic Metabolic Panel:  Recent Labs Lab 05/20/15 0940 05/20/15 1009 05/21/15 0700 05/22/15 0448  NA 137 140 139 138  K 4.5 4.3 4.4 4.2  CL 106 106 107 106  CO2 19*  --  25 20*  GLUCOSE 138* 136* 118* 111*  BUN 30* 31* 16 27*  CREATININE 1.20 1.00 0.84 1.21  CALCIUM 9.4  --  9.3 9.5   Liver Function Tests:  Recent Labs Lab 05/20/15 0940  AST 23  ALT 14*  ALKPHOS 70  BILITOT 0.8  PROT 6.9  ALBUMIN 3.5   No results for input(s): LIPASE, AMYLASE in the last 168 hours. No results for input(s): AMMONIA in the last 168 hours. CBC:  Recent Labs Lab 05/20/15 0940 05/20/15 1009 05/21/15 0700 05/22/15 0448  WBC 14.6*  --  11.9* 16.1*  NEUTROABS 12.1*  --   --   --   HGB 11.9* 14.3 12.0* 12.0*  HCT 36.8* 42.0 37.1* 36.9*  MCV 98.4  --  97.9 97.1  PLT 260  --  270 287   Cardiac Enzymes:  Recent Labs Lab 05/20/15 0940  CKTOTAL 69   BNP (last 3 results) No results for input(s): BNP in the last 8760 hours.  ProBNP (last 3 results) No results for input(s): PROBNP in the last 8760 hours.  CBG: No results for input(s): GLUCAP in the last 168 hours.  Recent  Results (from the past 240 hour(s))  Blood culture (routine x 2)     Status: None (Preliminary result)   Collection Time: 05/20/15  9:40 AM  Result Value Ref Range Status   Specimen Description BLOOD LEFT ANTECUBITAL  Final   Special Requests BOTTLES DRAWN AEROBIC AND ANAEROBIC 5CC  Final   Culture NO GROWTH 2 DAYS  Final   Report Status PENDING  Incomplete  Blood culture (routine x 2)     Status: None (Preliminary result)   Collection Time: 05/20/15 10:50 AM  Result Value Ref Range Status   Specimen Description BLOOD RIGHT ANTECUBITAL  Final   Special Requests BOTTLES DRAWN AEROBIC AND ANAEROBIC 10CC  Final   Culture NO GROWTH 2 DAYS  Final   Report Status PENDING  Incomplete  MRSA PCR Screening     Status: Abnormal   Collection Time: 05/20/15 11:48 PM    Result Value Ref Range Status   MRSA by PCR POSITIVE (A) NEGATIVE Final    Comment:        The GeneXpert MRSA Assay (FDA approved for NASAL specimens only), is one component of a comprehensive MRSA colonization surveillance program. It is not intended to diagnose MRSA infection nor to guide or monitor treatment for MRSA infections. RESULT CALLED TO, READ BACK BY AND VERIFIED WITH: C MOSELEY @0219  05/21/15 MKELLY      Studies: Ct Hip Right Wo Contrast  05/20/2015  CLINICAL DATA:  Patient found down today. Right hip pain and greater trochanter fracture by plain films. Initial encounter. EXAM: CT OF THE RIGHT HIP WITHOUT CONTRAST TECHNIQUE: Multidetector CT imaging of the right hip was performed according to the standard protocol. Multiplanar CT image reconstructions were also generated. COMPARISON:  Title of the right hip earlier today. FINDINGS: As seen on the plain films earlier today, there is a fracture of the greater trochanter with approximately 2 cm of superior distraction. No intertrochanteric extension is identified although bones are somewhat osteopenic. The right hip is located with mild degenerative change seen. Chondrocalcinosis about the right hip and symphysis pubis is noted. Imaged intrapelvic contents demonstrate no focal abnormality. Atherosclerotic vascular disease is noted. IMPRESSION: Right greater trochanter fracture with approximately 2 cm of superior distraction. No intertrochanteric extension is identified but the patient is osteopenic and fractures can be occult on CT scan. MRI without contrast is a more sensitive test. Electronically Signed   By: Inge Rise M.D.   On: 05/20/2015 16:08    Scheduled Meds: . amLODipine  10 mg Oral Daily   And  . benazepril  40 mg Oral Daily  . brimonidine  1 drop Both Eyes BID  . brinzolamide  1 drop Both Eyes TID  . Chlorhexidine Gluconate Cloth  6 each Topical Q0600  . enoxaparin (LOVENOX) injection  40 mg Subcutaneous Q24H   . feeding supplement (ENSURE ENLIVE)  237 mL Oral TID BM  . finasteride  5 mg Oral Daily  . hydrALAZINE  5 mg Oral BID  . latanoprost  1 drop Both Eyes QHS  . mupirocin ointment  1 application Nasal BID  . tamsulosin  0.4 mg Oral QPC supper   Continuous Infusions:  Antibiotics Given (last 72 hours)    None      Active Problems:   Leukocytosis   Trochanteric fracture of right femur (HCC)   Elevated lactic acid level   Hypertension   Fall   Hip fracture (HCC)   Malnutrition of moderate degree    Time spent: 35 min  [facility]  [facility] Pager [phone]. If 7PM-7AM, please contact night-coverage at [email or web address], password [facility] 05/22/2015, 2:04 PM  LOS: 2 days

## 2015-05-23 DIAGNOSIS — S72101D Unspecified trochanteric fracture of right femur, subsequent encounter for closed fracture with routine healing: Secondary | ICD-10-CM

## 2015-05-23 NOTE — Clinical Social Work Note (Signed)
Clinical Social Work Assessment  Patient Details  Name: [name] MRN: [id number] Date of Birth: 02-Jul-1917  Date of referral:  05/23/15               Reason for consult:  Facility Placement                Permission sought to share information with:  Case Manager, Customer service manager, Family Supports Permission granted to share information::  Yes, Verbal Permission Granted  Name::        Agency::  [facility]  Relationship::  [name]:  [phone]  Contact Information:     Housing/Transportation Living arrangements for the past 2 months:  [place] of Information:  Medical Team, Adult Children Patient Interpreter Needed:  None Criminal Activity/Legal Involvement Pertinent to Current Situation/Hospitalization:  No - Comment as needed Significant Relationships:  Adult Children Lives with:  Self Do you feel safe going back to the place where you live?  No (need for ST SNF) Need for family participation in patient care:  Yes (Comment) (due to AMS)  Care giving concerns:  Due to patient AMS: patient needs HCPOA involvement.  [name] reports patient has been to SNF in past, [facility], agreeable for SNF at this time due to level of function and around the clock care. Patient does have an aide for 3 hours, but patient requiring more at this time.   Social Worker assessment / plan:  LCSW completed assessment for patient and completed referral for SNF Completed FL2  Completed passar Faxed out Will have week day SW to follow up with bed offers per request. SNF at DC  Employment status:  Retired Forensic scientist:  Medicare, Managed Care PT Recommendations:  [place] / Referral to community resources:  [place]  Patient/Family's Response to care:  Agreeable to plan  Patient/Family's Understanding of and Emotional Response to Diagnosis, Current Treatment, and Prognosis:  Patient unable  to participate in assessment due to AMS.  HCPOA very reasonable and agreeable to DC plan.  HCPOA voices hope for first choices regarding SNF.    Emotional Assessment Appearance:  Appears stated age Attitude/Demeanor/Rapport:  Other (calm, cooperative) Affect (typically observed):  Accepting, Adaptable, Quiet Orientation:  Oriented to Self, Oriented to Place, Oriented to  Time, Oriented to Situation Alcohol / Substance use:  Not Applicable Psych involvement (Current and /or in the community):  No (Comment)  Discharge Needs  Concerns to be addressed:  Care Coordination Readmission within the last 30 days:  No Current discharge risk:  None Barriers to Discharge:  No SNF bed   [name] Cove, LCSW 05/23/2015, 1:25 PM

## 2015-05-23 NOTE — Progress Notes (Signed)
PROGRESS NOTE  Shahbaz Plachy [facility] [id number] DOB: 12-17-1917 DOA: 05/20/2015 PCP: [name], MD   HPI/Subjective: Patient is sleeping, easy to arouse confused and hard of hearing Awaiting SNF bed.  Assessment/Plan:  Right trochanteric fracture post fall at home this am. Ortho called by EDP -CT scan showed no intertrochanteric extension, no hip fracture. -Seen by Dr. Lorin Mercy, commended no surgical intervention and SNF placement for PT.  Leukocytosis / elevated lactic acid. Lower leg wounds may be responsible. Doesn't meet sepsis criteria despite elevated lactic acid. He has received 2 liters of IVF in ED.  -blood and urine culture- clean so far -monitor lactic acid levels.  -CXR without acute findings -no antibiotics for now, leg wounds don't look that bad. U/A and CXR unremarkable. Will obtain blood and urine cultures.  -Lactic acid was remarkably elevated at 4.2 on admission, this is normalizing less than 1 hour.  PAD / PVD with chronic (years) lower extremity ulcers. Declined surgical intervention. Recently started going to wound care in [facility] evaluation  Left anterior tibial basal cell carcinoma, s/p radiation Patient lives at home and has 5 hours of care giving a day-- 3 in AM and 2 in PM   Code Status: partial Family Communication: daughter--- she will bring in glasses and hearing aids Disposition Plan: will need SNF   Consultants:  ortho  Procedures:        Objective: Filed Vitals:   05/23/15 0432 05/23/15 1027  BP: 147/75 140/66  Pulse: 102 100  Temp: 98.3 F (36.8 C) 98.5 F (36.9 C)  Resp: 19 18    Intake/Output Summary (Last 24 hours) at 05/23/15 1305 Last data filed at 05/23/15 0900  Gross per 24 hour  Intake    120 ml  Output    800 ml  Net   -680 ml   Filed Weights   05/20/15 0944 05/20/15 2035 05/21/15 2027  Weight: 85.276 kg (188 lb) 79.8 kg (175 lb 14.8 oz) 78 kg (171 lb 15.3 oz)    Exam:   General:  Awake, VERY  hard of hearing  Cardiovascular: rrr  Respiratory: clear  Abdomen: +BS, soft  Musculoskeletal: mittens on hands , feet with creased skin  Data Reviewed: Basic Metabolic Panel:  Recent Labs Lab 05/20/15 0940 05/20/15 1009 05/21/15 0700 05/22/15 0448  NA 137 140 139 138  K 4.5 4.3 4.4 4.2  CL 106 106 107 106  CO2 19*  --  25 20*  GLUCOSE 138* 136* 118* 111*  BUN 30* 31* 16 27*  CREATININE 1.20 1.00 0.84 1.21  CALCIUM 9.4  --  9.3 9.5   Liver Function Tests:  Recent Labs Lab 05/20/15 0940  AST 23  ALT 14*  ALKPHOS 70  BILITOT 0.8  PROT 6.9  ALBUMIN 3.5   No results for input(s): LIPASE, AMYLASE in the last 168 hours. No results for input(s): AMMONIA in the last 168 hours. CBC:  Recent Labs Lab 05/20/15 0940 05/20/15 1009 05/21/15 0700 05/22/15 0448  WBC 14.6*  --  11.9* 16.1*  NEUTROABS 12.1*  --   --   --   HGB 11.9* 14.3 12.0* 12.0*  HCT 36.8* 42.0 37.1* 36.9*  MCV 98.4  --  97.9 97.1  PLT 260  --  270 287   Cardiac Enzymes:  Recent Labs Lab 05/20/15 0940  CKTOTAL 69   BNP (last 3 results) No results for input(s): BNP in the last 8760 hours.  ProBNP (last 3 results) No results for input(s): PROBNP in the  last 8760 hours.  CBG: No results for input(s): GLUCAP in the last 168 hours.  Recent Results (from the past 240 hour(s))  Blood culture (routine x 2)     Status: None (Preliminary result)   Collection Time: 05/20/15  9:40 AM  Result Value Ref Range Status   Specimen Description BLOOD LEFT ANTECUBITAL  Final   Special Requests BOTTLES DRAWN AEROBIC AND ANAEROBIC 5CC  Final   Culture NO GROWTH 3 DAYS  Final   Report Status PENDING  Incomplete  Blood culture (routine x 2)     Status: None (Preliminary result)   Collection Time: 05/20/15 10:50 AM  Result Value Ref Range Status   Specimen Description BLOOD RIGHT ANTECUBITAL  Final   Special Requests BOTTLES DRAWN AEROBIC AND ANAEROBIC 10CC  Final   Culture NO GROWTH 3 DAYS  Final    Report Status PENDING  Incomplete  MRSA PCR Screening     Status: Abnormal   Collection Time: 05/20/15 11:48 PM  Result Value Ref Range Status   MRSA by PCR POSITIVE (A) NEGATIVE Final    Comment:        The GeneXpert MRSA Assay (FDA approved for NASAL specimens only), is one component of a comprehensive MRSA colonization surveillance program. It is not intended to diagnose MRSA infection nor to guide or monitor treatment for MRSA infections. RESULT CALLED TO, READ BACK BY AND VERIFIED WITH: C MOSELEY @0219  05/21/15 MKELLY      Studies: No results found.  Scheduled Meds: . amLODipine  10 mg Oral Daily   And  . benazepril  40 mg Oral Daily  . brimonidine  1 drop Both Eyes BID  . brinzolamide  1 drop Both Eyes TID  . Chlorhexidine Gluconate Cloth  6 each Topical Q0600  . enoxaparin (LOVENOX) injection  40 mg Subcutaneous Q24H  . feeding supplement (ENSURE ENLIVE)  237 mL Oral TID BM  . finasteride  5 mg Oral Daily  . hydrALAZINE  5 mg Oral BID  . latanoprost  1 drop Both Eyes QHS  . mupirocin ointment  1 application Nasal BID  . tamsulosin  0.4 mg Oral QPC supper   Continuous Infusions:  Antibiotics Given (last 72 hours)    None      Active Problems:   Leukocytosis   Trochanteric fracture of right femur (HCC)   Elevated lactic acid level   Hypertension   Fall   Hip fracture ([place])   Malnutrition of moderate degree    Time spent: 35 min    [facility] Pager [phone]. If 7PM-7AM, please contact night-coverage at [email or web address], password [facility] 05/23/2015, 1:05 PM  LOS: 3 days

## 2015-05-23 NOTE — Clinical Social Work Placement (Signed)
   CLINICAL SOCIAL WORK PLACEMENT  NOTE  Date:  05/23/2015  Patient Details  Name: [name] [facility] MRN: [id number] Date of Birth: Jul 11, 1917  Clinical Social Work is seeking post-discharge placement for this patient at the [place] level of care (*CSW will initial, date and re-position this form in  chart as items are completed):  Yes   Patient/family provided with [facility] Work Department's list of facilities offering this level of care within the geographic area requested by the patient (or if unable, by the patient's family).      Patient/family informed of their freedom to choose among providers that offer the needed level of care, that participate in Medicare, Medicaid or managed care program needed by the patient, have an available bed and are willing to accept the patient.  Yes   Patient/family informed of [facility]'s ownership interest in [facility] and [facility], as well as of the fact that they are under no obligation to receive care at these facilities.  PASRR submitted to EDS on 05/23/15     PASRR number received on 05/23/15     Existing PASRR number confirmed on       FL2 transmitted to all facilities in geographic area requested by pt/family on 05/23/15     FL2 transmitted to all facilities within larger geographic area on       Patient informed that his/her managed care company has contracts with or will negotiate with certain facilities, including the following:            Patient/family informed of bed offers received.  Patient chooses bed at       Physician recommends and patient chooses bed at      Patient to be transferred to   on  .  Patient to be transferred to facility by       Patient family notified on   of transfer.  Name of family member notified:        PHYSICIAN Please sign FL2     Additional Comment:    _______________________________________________ [name], LCSW 05/23/2015,  1:29 PM

## 2015-05-24 MED ORDER — TRAMADOL HCL 50 MG PO TABS: 50.0000 mg | ORAL_TABLET | Freq: Four times a day (QID) | ORAL | Status: DC | PRN

## 2015-05-24 NOTE — Care Management Note (Signed)
Case Management Note  Patient Details  Name: [name] MRN: [id number] Date of Birth: 1917/11/14  Subjective/Objective:       CM following for progression and d/c planning.             Action/Plan: 05/24/2015 Pt for d/c to SNF today.   Expected Discharge Date:       05/24/2015           Expected Discharge Plan:  Skilled Nursing Facility  In-House Referral:  Clinical Social Work  Discharge planning Services  CM Consult  Post Acute Care Choice:   NA Choice offered to:   NA  DME Arranged:   NA DME Agency:   NA  HH Arranged:   NA HH Agency:   NA  Status of Service:  In process, will continue to follow  Medicare Important Message Given:  Yes Date Medicare IM Given:    Medicare IM give by:    Date Additional Medicare IM Given:    Additional Medicare Important Message give by:     If discussed at [place] of Stay Meetings, dates discussed:    Additional Comments:  Adron Bene, RN 05/24/2015, 4:20 PM

## 2015-05-24 NOTE — Discharge Summary (Addendum)
Physician Discharge Summary  [name] [id number] DOB: [birth year] DOA: 05/20/2015  PCP: [name] Panda, MD  Admit date: 05/20/2015 Discharge date: 05/24/2015  Time spent: 40 minutes  Recommendations for Outpatient Follow-up:  1. Follow-up with primary care physician within one week.   Discharge Diagnoses:  Active Problems:   Leukocytosis   Trochanteric fracture of right femur (HCC)   Elevated lactic acid level   Hypertension   Fall   Hip fracture (HCC)   Malnutrition of moderate degree   Discharge Condition: Stable  Diet recommendation: Heart healthy  Filed Weights   05/20/15 2035 05/21/15 2027 05/23/15 2100  Weight: 79.8 kg (175 lb 14.8 oz) 78 kg (171 lb 15.3 oz) 78 kg (171 lb 15.3 oz)    History of present illness:  [name] is a 82 y.o. male who fell at home during the night. Home health provider found him on floor. Per daughter, patient fell around 6:00am while trying to get out of bed to urinate. Patient very hard of hearing, most of the history comes from the daughter and granddaughter. Patient "hurts all over" Daughter states patient is usually alert and oriented.   Hospital Course:   Right greater trochanteric fracture post fall at home this am. Ortho called by EDP -CT scan showed no intertrochanteric extension, no hip fracture. -Seen by Dr. Lorin Mercy, recommended no surgical intervention and SNF placement for PT.  Leukocytosis / elevated lactic acid. Lower leg wounds may be responsible. Doesn't meet sepsis criteria despite elevated lactic acid. He has received 2 liters of IVF in ED.  -blood and urine culture- clean so far -monitor lactic acid levels.  -CXR without acute findings -no antibiotics for now, leg ulcers do not look infected. U/A and CXR unremarkable. Urine and blood cultures NGTD. -Lactic acid was remarkably elevated at 4.2 on admission, this is normalized in about 3 hours after fluid.  PAD / PVD with chronic (years) lower extremity  ulcers. Declined surgical intervention. Recently started going to wound care in [facility] evaluation  Left anterior tibial basal cell carcinoma, s/p radiation Patient lives at home and has 5 hours of care giving a day-- 3 in AM and 2 in PM  Malnutrition Dilatation of moderate degree, follow-up with the dietitian in the nursing home.  Social issues There is no diagnosis of dementia, patient has advanced age of 82 years old. His legs has multiple creases and ulcers, does not seem to be walking for very long time. Family reports that he is usually awake oriented, I doubt how oriented he is. Please consider palliative care consultation for goals of care's.  Procedures:  None  Consultations:  Ortho  Discharge Exam: Filed Vitals:   05/24/15 0500 05/24/15 0836  BP: 131/66 131/65  Pulse: 64 74  Temp: 98.3 F (36.8 C) 97.5 F (36.4 C)  Resp: 18 18   General: Alert and awake, oriented x3, not in any acute distress. HEENT: anicteric sclera, pupils reactive to light and accommodation, EOMI CVS: S1-S2 clear, no murmur rubs or gallops Chest: clear to auscultation bilaterally, no wheezing, rales or rhonchi Abdomen: soft nontender, nondistended, normal bowel sounds, no organomegaly Extremities: no cyanosis, clubbing or edema noted bilaterally Neuro: Cranial nerves II-XII intact, no focal neurological deficits  Discharge Instructions   Discharge Instructions    Diet - low sodium heart healthy    Complete by:  As directed      Increase activity slowly    Complete by:  As directed  Current Discharge Medication List    CONTINUE these medications which have CHANGED   Details  traMADol (ULTRAM) 50 MG tablet Take 1 tablet (50 mg total) by mouth every 6 (six) hours as needed (PAIN). Qty: 30 tablet, Refills: 0      CONTINUE these medications which have NOT CHANGED   Details  acetaminophen (TYLENOL) 500 MG tablet Take 1,000 mg by mouth every 4 (four) hours as needed  for moderate pain.    amLODipine-benazepril (LOTREL) 10-40 MG per capsule Take 1 capsule by mouth daily.    brimonidine (ALPHAGAN) 0.15 % ophthalmic solution Place 1 drop into both eyes 2 (two) times daily.    brinzolamide (AZOPT) 1 % ophthalmic suspension Place 1 drop into both eyes 3 (three) times daily.    clopidogrel (PLAVIX) 75 MG tablet Take 75 mg by mouth daily with breakfast.    finasteride (PROSCAR) 5 MG tablet Take 5 mg by mouth daily.    hydrALAZINE (APRESOLINE) 10 MG tablet Take 5 mg by mouth 2 (two) times daily. TAKE 1/2 TABLET    latanoprost (XALATAN) 0.005 % ophthalmic solution Place 1 drop into both eyes at bedtime.    MEGARED OMEGA-3 KRILL OIL 500 MG CAPS Take 1 tablet by mouth daily.    Multiple Vitamins-Minerals (ICAPS AREDS 2 PO) Take 1 tablet by mouth.    tamsulosin (FLOMAX) 0.4 MG CAPS capsule Take 0.4 mg by mouth daily after supper.       No Known Allergies Follow-up Information    Follow up with [name] Panda, MD In 1 week.   Specialty:  Internal Medicine   Contact information:   [address] [place] [address] [phone]        The results of significant diagnostics from this hospitalization (including imaging, microbiology, ancillary and laboratory) are listed below for reference.    Significant Diagnostic Studies: Ct Head Wo Contrast  05/20/2015  CLINICAL DATA:  Fall on Plavix.  Found on floor.  Initial encounter. EXAM: CT HEAD WITHOUT CONTRAST CT CERVICAL SPINE WITHOUT CONTRAST TECHNIQUE: Multidetector CT imaging of the head and cervical spine was performed following the standard protocol without intravenous contrast. Multiplanar CT image reconstructions of the cervical spine were also generated. COMPARISON:  None. FINDINGS: CT HEAD FINDINGS Skull and Sinuses:Negative for fracture. There is subtle deep soft tissue thickening at the vertex, not convincing for acute contusion. Bilateral mild chronic mastoiditis with sclerosis and opacification in  the mastoid tips. Visualized orbits: No traumatic finding.  Right cataract resection Brain: No evidence of acute infarction, hemorrhage, hydrocephalus, or mass lesion/mass effect. Age congruent generalized volume loss and small vessel ischemic change in the deep cerebral white matter. CT CERVICAL SPINE FINDINGS Negative for acute fracture or subluxation. No prevertebral edema. No gross cervical canal hematoma. Multilevel disc degeneration with occasional calcification. There is notable bulging disc at C4-5 with ventral cord contact but no suspected mass-effect. Biapical pleural based scarring. IMPRESSION: No evidence of intracranial or cervical spine injury. Electronically Signed   By: Monte Fantasia M.D.   On: 05/20/2015 10:17   Ct Cervical Spine Wo Contrast  05/20/2015  CLINICAL DATA:  Fall on Plavix.  Found on floor.  Initial encounter. EXAM: CT HEAD WITHOUT CONTRAST CT CERVICAL SPINE WITHOUT CONTRAST TECHNIQUE: Multidetector CT imaging of the head and cervical spine was performed following the standard protocol without intravenous contrast. Multiplanar CT image reconstructions of the cervical spine were also generated. COMPARISON:  None. FINDINGS: CT HEAD FINDINGS Skull and Sinuses:Negative for fracture. There is subtle deep  soft tissue thickening at the vertex, not convincing for acute contusion. Bilateral mild chronic mastoiditis with sclerosis and opacification in the mastoid tips. Visualized orbits: No traumatic finding.  Right cataract resection Brain: No evidence of acute infarction, hemorrhage, hydrocephalus, or mass lesion/mass effect. Age congruent generalized volume loss and small vessel ischemic change in the deep cerebral white matter. CT CERVICAL SPINE FINDINGS Negative for acute fracture or subluxation. No prevertebral edema. No gross cervical canal hematoma. Multilevel disc degeneration with occasional calcification. There is notable bulging disc at C4-5 with ventral cord contact but no  suspected mass-effect. Biapical pleural based scarring. IMPRESSION: No evidence of intracranial or cervical spine injury. Electronically Signed   By: Monte Fantasia M.D.   On: 05/20/2015 10:17   Dg Pelvis Portable  05/20/2015  CLINICAL DATA:  Status post fall this morning. EXAM: PORTABLE PELVIS 1-2 VIEWS COMPARISON:  None. FINDINGS: There is an isolated right greater trochanteric fracture. There is no other fracture or dislocation. 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 IMPRESSION: Isolated right greater trochanteric fracture on a single AP view. If there is further clinical concern, recommend dedicated right hip views or a CT of the right hip. Electronically Signed   By: Kathreen Devoid   On: 05/20/2015 10:41   Ct Hip Right Wo Contrast  05/20/2015  CLINICAL DATA:  Patient found down today. Right hip pain and greater trochanter fracture by plain films. Initial encounter. EXAM: CT OF THE RIGHT HIP WITHOUT CONTRAST TECHNIQUE: Multidetector CT imaging of the right hip was performed according to the standard protocol. Multiplanar CT image reconstructions were also generated. COMPARISON:  Title of the right hip earlier today. FINDINGS: As seen on the plain films earlier today, there is a fracture of the greater trochanter with approximately 2 cm of superior distraction. No intertrochanteric extension is identified although bones are somewhat osteopenic. The right hip is located with mild degenerative change seen. Chondrocalcinosis about the right hip and symphysis pubis is noted. Imaged intrapelvic contents demonstrate no focal abnormality. Atherosclerotic vascular disease is noted. IMPRESSION: Right greater trochanter fracture with approximately 2 cm of superior distraction. No intertrochanteric extension is identified but the patient is osteopenic and fractures can be occult on CT scan. MRI without contrast is a more sensitive test. Electronically Signed   By: Inge Rise M.D.   On: 05/20/2015 16:08    Dg Chest Portable 1 View  05/20/2015  CLINICAL DATA:  Status post fall.  Sore. EXAM: PORTABLE CHEST 1 VIEW COMPARISON:  08/22/2013 FINDINGS: There is bilateral chronic interstitial thickening. There is no focal consolidation. There is no pleural effusion or pneumothorax. The heart and mediastinal contours are unremarkable. There is thoracic aortic atherosclerosis. There is a small hiatal hernia. The osseous structures are unremarkable. IMPRESSION: No active disease. Electronically Signed   By: Kathreen Devoid   On: 05/20/2015 10:45   Dg Hip Unilat With Pelvis 2-3 Views Right  05/20/2015  CLINICAL DATA:  Fall this morning with limited range of motion in the right leg. Initial encounter. EXAM: DG HIP (WITH OR WITHOUT PELVIS) 2-3V RIGHT COMPARISON:  None. FINDINGS: Mildly distracted greater trochanter fracture of the right femur. There is no evidence of intertrochanteric extension, but MRI would be the most sensitive method for evaluation in this patient with marked osteopenia. No evidence of pelvic ring fracture. Located right hip. IMPRESSION: Mildly distracted greater trochanter fracture on the right. No intertrochanteric extension identified, but MRI would have the highest sensitivity for fracture characterization. Electronically Signed   By:  Monte Fantasia M.D.   On: 05/20/2015 12:09    Microbiology: Recent Results (from the past 240 hour(s))  Blood culture (routine x 2)     Status: None (Preliminary result)   Collection Time: 05/20/15  9:40 AM  Result Value Ref Range Status   Specimen Description BLOOD LEFT ANTECUBITAL  Final   Special Requests BOTTLES DRAWN AEROBIC AND ANAEROBIC 5CC  Final   Culture NO GROWTH 3 DAYS  Final   Report Status PENDING  Incomplete  Blood culture (routine x 2)     Status: None (Preliminary result)   Collection Time: 05/20/15 10:50 AM  Result Value Ref Range Status   Specimen Description BLOOD RIGHT ANTECUBITAL  Final   Special Requests BOTTLES DRAWN AEROBIC AND  ANAEROBIC 10CC  Final   Culture NO GROWTH 3 DAYS  Final   Report Status PENDING  Incomplete  MRSA PCR Screening     Status: Abnormal   Collection Time: 05/20/15 11:48 PM  Result Value Ref Range Status   MRSA by PCR POSITIVE (A) NEGATIVE Final    Comment:        The GeneXpert MRSA Assay (FDA approved for NASAL specimens only), is one component of a comprehensive MRSA colonization surveillance program. It is not intended to diagnose MRSA infection nor to guide or monitor treatment for MRSA infections. RESULT CALLED TO, READ BACK BY AND VERIFIED WITH: C MOSELEY @0219  05/21/15 MKELLY      Labs: Basic Metabolic Panel:  Recent Labs Lab 05/20/15 0940 05/20/15 1009 05/21/15 0700 05/22/15 0448  NA 137 140 139 138  K 4.5 4.3 4.4 4.2  CL 106 106 107 106  CO2 19*  --  25 20*  GLUCOSE 138* 136* 118* 111*  BUN 30* 31* 16 27*  CREATININE 1.20 1.00 0.84 1.21  CALCIUM 9.4  --  9.3 9.5   Liver Function Tests:  Recent Labs Lab 05/20/15 0940  AST 23  ALT 14*  ALKPHOS 70  BILITOT 0.8  PROT 6.9  ALBUMIN 3.5   No results for input(s): LIPASE, AMYLASE in the last 168 hours. No results for input(s): AMMONIA in the last 168 hours. CBC:  Recent Labs Lab 05/20/15 0940 05/20/15 1009 05/21/15 0700 05/22/15 0448  WBC 14.6*  --  11.9* 16.1*  NEUTROABS 12.1*  --   --   --   HGB 11.9* 14.3 12.0* 12.0*  HCT 36.8* 42.0 37.1* 36.9*  MCV 98.4  --  97.9 97.1  PLT 260  --  270 287   Cardiac Enzymes:  Recent Labs Lab 05/20/15 0940  CKTOTAL 69   BNP: BNP (last 3 results) No results for input(s): BNP in the last 8760 hours.  ProBNP (last 3 results) No results for input(s): PROBNP in the last 8760 hours.  CBG: No results for input(s): GLUCAP in the last 168 hours.     Signed:  Birdie Hopes MD.  [facility] 05/24/2015, 11:58 AM

## 2015-05-24 NOTE — Clinical Social Work Placement (Signed)
   CLINICAL SOCIAL WORK PLACEMENT  NOTE 05/24/15 - DISCHARGED TO [facility]  Date:  05/24/2015  Patient Details  Name: [name] [facility] MRN: [id number] Date of Birth: Apr 03, 1918  Clinical Social Work is seeking post-discharge placement for this patient at the [place] level of care (*CSW will initial, date and re-position this form in  chart as items are completed):  Yes   Patient/family provided with [facility] Work Department's list of facilities offering this level of care within the geographic area requested by the patient (or if unable, by the patient's family).      Patient/family informed of their freedom to choose among providers that offer the needed level of care, that participate in Medicare, Medicaid or managed care program needed by the patient, have an available bed and are willing to accept the patient.  Yes   Patient/family informed of [facility]'s ownership interest in [facility] and [facility], as well as of the fact that they are under no obligation to receive care at these facilities.  PASRR submitted to EDS on 05/23/15     PASRR number received on 05/23/15     Existing PASRR number confirmed on       FL2 transmitted to all facilities in geographic area requested by pt/family on 05/23/15     FL2 transmitted to all facilities within larger geographic area on       Patient informed that his/her managed care company has contracts with or will negotiate with certain facilities, including the following:         05/24/15 - Patient/family informed of bed offers received.  Patient chooses bed at  [facility] and [place] recommends and patient chooses bed at      Patient to be transferred to   [facility] on  05/24/15.  Patient to be transferred to facility by  ambulance     Patient family notified on  05/24/15 of transfer.  Name of family member notified:   Daughter Janyth Contes [phone]     PHYSICIAN Please sign FL2     Additional Comment:    _______________________________________________ Sable Feil, LCSW 05/24/2015, 2:43 PM

## 2015-05-24 NOTE — Care Management Important Message (Signed)
Important Message  Patient Details  Name: [name] MRN: [id number] Date of Birth: 11/06/1917   Medicare Important Message Given:  Yes    Cachet Mccutchen, Rory Percy, RN 05/24/2015, 10:31 AM

## 2015-05-25 ENCOUNTER — Encounter: Payer: Self-pay | Admitting: Internal Medicine

## 2015-05-25 ENCOUNTER — Non-Acute Institutional Stay (SKILLED_NURSING_FACILITY): Payer: Medicare Other | Admitting: Internal Medicine

## 2015-05-25 DIAGNOSIS — I10 Essential (primary) hypertension: Secondary | ICD-10-CM | POA: Diagnosis not present

## 2015-05-25 DIAGNOSIS — C44719 Basal cell carcinoma of skin of left lower limb, including hip: Secondary | ICD-10-CM

## 2015-05-25 DIAGNOSIS — W19XXXD Unspecified fall, subsequent encounter: Secondary | ICD-10-CM

## 2015-05-25 DIAGNOSIS — E440 Moderate protein-calorie malnutrition: Secondary | ICD-10-CM

## 2015-05-25 DIAGNOSIS — S72101D Unspecified trochanteric fracture of right femur, subsequent encounter for closed fracture with routine healing: Secondary | ICD-10-CM | POA: Diagnosis not present

## 2015-05-25 DIAGNOSIS — R531 Weakness: Secondary | ICD-10-CM

## 2015-05-25 DIAGNOSIS — Y92009 Unspecified place in unspecified non-institutional (private) residence as the place of occurrence of the external cause: Secondary | ICD-10-CM

## 2015-05-25 LAB — CULTURE, BLOOD (ROUTINE X 2)
Culture: NO GROWTH
Culture: NO GROWTH

## 2015-05-25 NOTE — Progress Notes (Signed)
Patient ID: [name], male   DOB: 1917/05/20, 82 y.o.   MRN: [id number]    HISTORY AND PHYSICAL   DATE: 05/25/15  Location:  [facility]    Place of Service: SNF [phone])   Extended Emergency Contact Information Primary Emergency Contact: [facility] Phone: [phone] Relation: Daughter  Advanced Directive information  FULL CODE  Chief Complaint  Patient presents with  . New Admit To SNF    HPI:  82 yo male seen today as a new admission into SNF following hospital stay for right closed greater trochanteric fx s/p unwitnessed fall at home, moderate malnutrition, elevated lactic acidosis, leukocytosis, HTN. CT revealed no extension of fx into intertrochanteric region and thus no hip fx. Ortho did not recommend surgical intervention. WBCs and lactic acid level up thought to be due to leg wounds (blood and urine cx neg, CXR no acute process). No Abx given as leg wounds did not appear infected. Lactic acid nml with IVF. foley cath present for comfort due to femur fx. He presents to SNF for PT.  He has no c/o today. Takes prn tramadol for pain. He refused breakfast this AM. He is not really oriented and gets confused.  Left Pretibial BCC - s/p XRT. Followed by rad oncology  HTN - BP elevated today but has not received AM meds yet. Takes lotrel and hydralazine  BPH  - on flomax and proscar  PAD - on plavix  Glaucoma - has eye gtts  Past Medical History  Diagnosis Date  . Arthritis   . Cancer ([place])     bladder  . Hypertension   . S/P radiation therapy 02/15/2015 through 03/18/2015    Left pretibial region 4400 cGy in 10 sessions, two sessions a week for 5 weeks   . PAD (peripheral artery disease) ([place])   . PVD (peripheral vascular disease) [facility])     Past Surgical History  Procedure Laterality Date  . Achilles tendon surgery      Patient Care Team: [name], MD as PCP - General  (Internal Medicine)  Social History   Social History  . Marital Status: Married    Spouse Name: N/A  . Number of Children: N/A  . Years of Education: N/A   Occupational History  . Not on file.   Social History Main Topics  . Smoking status: Current Some Day Smoker    Types: Pipe  . Smokeless tobacco: Never Used  . Alcohol Use: 1.2 oz/week    2 Glasses of wine per week  . Drug Use: No  . Sexual Activity: No   Other Topics Concern  . Not on file   Social History Narrative     reports that he has been smoking Pipe.  He has never used smokeless tobacco. He reports that he drinks about 1.2 oz of alcohol per week. He reports that he does not use illicit drugs.  Family History  Problem Relation Age of Onset  . Arthritis Mother   . Arthritis Mother   . Hearing loss Father    No family status information on file.    Immunization History  Administered Date(s) Administered  . Pneumococcal Polysaccharide-23 08/23/2013    No Known Allergies  Medications: Patient's Medications  New Prescriptions   No medications on file  Previous Medications   ACETAMINOPHEN (TYLENOL) 500 MG TABLET    Take 1,000 mg by mouth every 4 (four) hours as needed for moderate pain.   AMLODIPINE-BENAZEPRIL (  LOTREL) 10-40 MG PER CAPSULE    Take 1 capsule by mouth daily.   BRIMONIDINE (ALPHAGAN) 0.15 % OPHTHALMIC SOLUTION    Place 1 drop into both eyes 2 (two) times daily.   BRINZOLAMIDE (AZOPT) 1 % OPHTHALMIC SUSPENSION    Place 1 drop into both eyes 3 (three) times daily.   CLOPIDOGREL (PLAVIX) 75 MG TABLET    Take 75 mg by mouth daily with breakfast.   FINASTERIDE (PROSCAR) 5 MG TABLET    Take 5 mg by mouth daily.   HYDRALAZINE (APRESOLINE) 10 MG TABLET    Take 5 mg by mouth 2 (two) times daily. TAKE 1/2 TABLET   LATANOPROST (XALATAN) 0.005 % OPHTHALMIC SOLUTION    Place 1 drop into both eyes at bedtime.   MEGARED OMEGA-3 KRILL OIL 500 MG CAPS    Take 1 tablet by mouth daily.   MULTIPLE  VITAMINS-MINERALS (ICAPS AREDS 2 PO)    Take 1 tablet by mouth.   TAMSULOSIN (FLOMAX) 0.4 MG CAPS CAPSULE    Take 0.4 mg by mouth daily after supper.   TRAMADOL (ULTRAM) 50 MG TABLET    Take 1 tablet (50 mg total) by mouth every 6 (six) hours as needed (PAIN).  Modified Medications   No medications on file  Discontinued Medications   No medications on file    Review of Systems  Unable to perform ROS: Other  pt very HOH  Filed Vitals:   05/25/15 1140  BP: 158/76  Pulse: 80  Temp: 97.5 F (36.4 C)  Weight: 171 lb 12.8 oz (77.928 kg)   Body mass index is 24.65 kg/(m^2).  Physical Exam  Constitutional: He appears well-developed.  Sitting up in bed in NAD. Frail appearing. HOH  HENT:  Mouth/Throat: Oropharynx is clear and moist.  Eyes: Pupils are equal, round, and reactive to light. No scleral icterus.  Neck: Neck supple. Carotid bruit is not present.  Cardiovascular: Normal rate, regular rhythm and intact distal pulses.  Exam reveals no gallop and no friction rub.   Murmur (1/6 SEM) heard. +1 pitting LE edema b/l with chronic venous stasis changes. No calf TTP  Pulmonary/Chest: Effort normal and breath sounds normal. He has no wheezes. He has no rales. He exhibits no tenderness.  Abdominal: Soft. Bowel sounds are normal. He exhibits no distension, no abdominal bruit, no pulsatile midline mass and no mass. There is no tenderness. There is no rebound and no guarding.  Genitourinary:  Foley cath DTG dark yellow urine  Musculoskeletal: He exhibits edema and tenderness.  RLE externally rotated. No bony deformity  Lymphadenopathy:    He has no cervical adenopathy.  Neurological: He is alert.  Skin: Skin is warm and dry.  Chronic venous stasis changes b/l LE with b/l leg dressing c/d/i. Scaling feet. No ulcerations. (+) calluses  Psychiatric: He has a normal mood and affect. His behavior is normal.     Labs reviewed: Admission on 05/20/2015, Discharged on 05/24/2015  Component  Date Value Ref Range Status  . WBC 05/20/2015 14.6* 4.0 - 10.5 K/uL Final  . RBC 05/20/2015 3.74* 4.22 - 5.81 MIL/uL Final  . Hemoglobin 05/20/2015 11.9* 13.0 - 17.0 g/dL Final  . HCT 05/20/2015 36.8* 39.0 - 52.0 % Final  . MCV 05/20/2015 98.4  78.0 - 100.0 fL Final  . MCH 05/20/2015 31.8  26.0 - 34.0 pg Final  . MCHC 05/20/2015 32.3  30.0 - 36.0 g/dL Final  . RDW 05/20/2015 14.2  11.5 - 15.5 % Final  .  Platelets 05/20/2015 260  150 - 400 K/uL Final  . Neutrophils Relative % 05/20/2015 83   Final  . Neutro Abs 05/20/2015 12.1* 1.7 - 7.7 K/uL Final  . Lymphocytes Relative 05/20/2015 11   Final  . Lymphs Abs 05/20/2015 1.6  0.7 - 4.0 K/uL Final  . Monocytes Relative 05/20/2015 6   Final  . Monocytes Absolute 05/20/2015 0.8  0.1 - 1.0 K/uL Final  . Eosinophils Relative 05/20/2015 0   Final  . Eosinophils Absolute 05/20/2015 0.0  0.0 - 0.7 K/uL Final  . Basophils Relative 05/20/2015 0   Final  . Basophils Absolute 05/20/2015 0.0  0.0 - 0.1 K/uL Final  . Sodium 05/20/2015 137  135 - 145 mmol/L Final  . Potassium 05/20/2015 4.5  3.5 - 5.1 mmol/L Final  . Chloride 05/20/2015 106  101 - 111 mmol/L Final  . CO2 05/20/2015 19* 22 - 32 mmol/L Final  . Glucose, Bld 05/20/2015 138* 65 - 99 mg/dL Final  . BUN 05/20/2015 30* 6 - 20 mg/dL Final  . Creatinine, Ser 05/20/2015 1.20  0.61 - 1.24 mg/dL Final  . Calcium 05/20/2015 9.4  8.9 - 10.3 mg/dL Final  . Total Protein 05/20/2015 6.9  6.5 - 8.1 g/dL Final  . Albumin 05/20/2015 3.5  3.5 - 5.0 g/dL Final  . AST 05/20/2015 23  15 - 41 U/L Final  . ALT 05/20/2015 14* 17 - 63 U/L Final  . Alkaline Phosphatase 05/20/2015 70  38 - 126 U/L Final  . Total Bilirubin 05/20/2015 0.8  0.3 - 1.2 mg/dL Final  . GFR calc non Af Amer 05/20/2015 50* >60 mL/min Final  . GFR calc Af Amer 05/20/2015 57* >60 mL/min Final   Comment: (NOTE) The eGFR has been calculated using the CKD EPI equation. This calculation has not been validated in all clinical  situations. eGFR's persistently <60 mL/min signify possible Chronic Kidney Disease.   . Anion gap 05/20/2015 12  5 - 15 Final  . Lactic Acid, Venous 05/20/2015 4.20* 0.5 - 2.0 mmol/L Final  . Comment 05/20/2015 NOTIFIED PHYSICIAN   Final  . Sodium 05/20/2015 140  135 - 145 mmol/L Final  . Potassium 05/20/2015 4.3  3.5 - 5.1 mmol/L Final  . Chloride 05/20/2015 106  101 - 111 mmol/L Final  . BUN 05/20/2015 31* 6 - 20 mg/dL Final  . Creatinine, Ser 05/20/2015 1.00  0.61 - 1.24 mg/dL Final  . Glucose, Bld 05/20/2015 136* 65 - 99 mg/dL Final  . Calcium, Ion 05/20/2015 1.22  1.13 - 1.30 mmol/L Final  . TCO2 05/20/2015 20  0 - 100 mmol/L Final  . Hemoglobin 05/20/2015 14.3  13.0 - 17.0 g/dL Final  . HCT 05/20/2015 42.0  39.0 - 52.0 % Final  . Prothrombin Time 05/20/2015 15.5* 11.6 - 15.2 seconds Final  . INR 05/20/2015 1.21  0.00 - 1.49 Final  . aPTT 05/20/2015 30  24 - 37 seconds Final  . Specimen Description 05/20/2015 BLOOD LEFT ANTECUBITAL   Final  . Special Requests 05/20/2015 BOTTLES DRAWN AEROBIC AND ANAEROBIC 5CC   Final  . Culture 05/20/2015 NO GROWTH 4 DAYS   Final  . Report Status 05/20/2015 PENDING   Incomplete  . Specimen Description 05/20/2015 BLOOD RIGHT ANTECUBITAL   Final  . Special Requests 05/20/2015 BOTTLES DRAWN AEROBIC AND ANAEROBIC 10CC   Final  . Culture 05/20/2015 NO GROWTH 4 DAYS   Final  . Report Status 05/20/2015 PENDING   Incomplete  . Color, Urine 05/20/2015 YELLOW  YELLOW  Final  . APPearance 05/20/2015 CLEAR  CLEAR Final  . Specific Gravity, Urine 05/20/2015 1.018  1.005 - 1.030 Final  . pH 05/20/2015 6.5  5.0 - 8.0 Final  . Glucose, UA 05/20/2015 NEGATIVE  NEGATIVE mg/dL Final  . Hgb urine dipstick 05/20/2015 NEGATIVE  NEGATIVE Final  . Bilirubin Urine 05/20/2015 NEGATIVE  NEGATIVE Final  . Ketones, ur 05/20/2015 NEGATIVE  NEGATIVE mg/dL Final  . Protein, ur 05/20/2015 NEGATIVE  NEGATIVE mg/dL Final  . Nitrite 05/20/2015 NEGATIVE  NEGATIVE Final  .  Leukocytes, UA 05/20/2015 NEGATIVE  NEGATIVE Final   MICROSCOPIC NOT DONE ON URINES WITH NEGATIVE PROTEIN, BLOOD, LEUKOCYTES, NITRITE, OR GLUCOSE <1000 mg/dL.  Marland Kitchen Total CK 05/20/2015 69  49 - 397 U/L Final  . Troponin i, poc 05/20/2015 0.02  0.00 - 0.08 ng/mL Final  . Comment 3 05/20/2015          Final   Comment: Due to the release kinetics of cTnI, a negative result within the first hours of the onset of symptoms does not rule out myocardial infarction with certainty. If myocardial infarction is still suspected, repeat the test at appropriate intervals.   . Lactic Acid, Venous 05/20/2015 1.05  0.5 - 2.0 mmol/L Final  . Lactic Acid, Venous 05/20/2015 0.93  0.5 - 2.0 mmol/L Final  . Lactic Acid, Venous 05/20/2015 1.0  0.5 - 2.0 mmol/L Final  . WBC 05/21/2015 11.9* 4.0 - 10.5 K/uL Final  . RBC 05/21/2015 3.79* 4.22 - 5.81 MIL/uL Final  . Hemoglobin 05/21/2015 12.0* 13.0 - 17.0 g/dL Final  . HCT 05/21/2015 37.1* 39.0 - 52.0 % Final  . MCV 05/21/2015 97.9  78.0 - 100.0 fL Final  . MCH 05/21/2015 31.7  26.0 - 34.0 pg Final  . MCHC 05/21/2015 32.3  30.0 - 36.0 g/dL Final  . RDW 05/21/2015 14.1  11.5 - 15.5 % Final  . Platelets 05/21/2015 270  150 - 400 K/uL Final  . Sodium 05/21/2015 139  135 - 145 mmol/L Final  . Potassium 05/21/2015 4.4  3.5 - 5.1 mmol/L Final  . Chloride 05/21/2015 107  101 - 111 mmol/L Final  . CO2 05/21/2015 25  22 - 32 mmol/L Final  . Glucose, Bld 05/21/2015 118* 65 - 99 mg/dL Final  . BUN 05/21/2015 16  6 - 20 mg/dL Final  . Creatinine, Ser 05/21/2015 0.84  0.61 - 1.24 mg/dL Final  . Calcium 05/21/2015 9.3  8.9 - 10.3 mg/dL Final  . GFR calc non Af Amer 05/21/2015 >60  >60 mL/min Final  . GFR calc Af Amer 05/21/2015 >60  >60 mL/min Final   Comment: (NOTE) The eGFR has been calculated using the CKD EPI equation. This calculation has not been validated in all clinical situations. eGFR's persistently <60 mL/min signify possible Chronic Kidney Disease.   . Anion  gap 05/21/2015 7  5 - 15 Final  . MRSA by PCR 05/20/2015 POSITIVE* NEGATIVE Final   Comment:        The GeneXpert MRSA Assay (FDA approved for NASAL specimens only), is one component of a comprehensive MRSA colonization surveillance program. It is not intended to diagnose MRSA infection nor to guide or monitor treatment for MRSA infections. RESULT CALLED TO, READ BACK BY AND VERIFIED WITH: C MOSELEY _0  05/21/15 MKELLY   . WBC 05/22/2015 16.1* 4.0 - 10.5 K/uL Final  . RBC 05/22/2015 3.80* 4.22 - 5.81 MIL/uL Final  . Hemoglobin 05/22/2015 12.0* 13.0 - 17.0 g/dL Final  . HCT 05/22/2015 36.9* 39.0 -  52.0 % Final  . MCV 05/22/2015 97.1  78.0 - 100.0 fL Final  . MCH 05/22/2015 31.6  26.0 - 34.0 pg Final  . MCHC 05/22/2015 32.5  30.0 - 36.0 g/dL Final  . RDW 05/22/2015 14.0  11.5 - 15.5 % Final  . Platelets 05/22/2015 287  150 - 400 K/uL Final  . Sodium 05/22/2015 138  135 - 145 mmol/L Final  . Potassium 05/22/2015 4.2  3.5 - 5.1 mmol/L Final  . Chloride 05/22/2015 106  101 - 111 mmol/L Final  . CO2 05/22/2015 20* 22 - 32 mmol/L Final  . Glucose, Bld 05/22/2015 111* 65 - 99 mg/dL Final  . BUN 05/22/2015 27* 6 - 20 mg/dL Final  . Creatinine, Ser 05/22/2015 1.21  0.61 - 1.24 mg/dL Final  . Calcium 05/22/2015 9.5  8.9 - 10.3 mg/dL Final  . GFR calc non Af Amer 05/22/2015 49* >60 mL/min Final  . GFR calc Af Amer 05/22/2015 57* >60 mL/min Final   Comment: (NOTE) The eGFR has been calculated using the CKD EPI equation. This calculation has not been validated in all clinical situations. eGFR's persistently <60 mL/min signify possible Chronic Kidney Disease.   . Anion gap 05/22/2015 12  5 - 15 Final    Ct Head Wo Contrast  05/20/2015  CLINICAL DATA:  Fall on Plavix.  Found on floor.  Initial encounter. EXAM: CT HEAD WITHOUT CONTRAST CT CERVICAL SPINE WITHOUT CONTRAST TECHNIQUE: Multidetector CT imaging of the head and cervical spine was performed following the standard protocol without  intravenous contrast. Multiplanar CT image reconstructions of the cervical spine were also generated. COMPARISON:  None. FINDINGS: CT HEAD FINDINGS Skull and Sinuses:Negative for fracture. There is subtle deep soft tissue thickening at the vertex, not convincing for acute contusion. Bilateral mild chronic mastoiditis with sclerosis and opacification in the mastoid tips. Visualized orbits: No traumatic finding.  Right cataract resection Brain: No evidence of acute infarction, hemorrhage, hydrocephalus, or mass lesion/mass effect. Age congruent generalized volume loss and small vessel ischemic change in the deep cerebral white matter. CT CERVICAL SPINE FINDINGS Negative for acute fracture or subluxation. No prevertebral edema. No gross cervical canal hematoma. Multilevel disc degeneration with occasional calcification. There is notable bulging disc at C4-5 with ventral cord contact but no suspected mass-effect. Biapical pleural based scarring. IMPRESSION: No evidence of intracranial or cervical spine injury. Electronically Signed   By: Monte Fantasia M.D.   On: 05/20/2015 10:17   Ct Cervical Spine Wo Contrast  05/20/2015  CLINICAL DATA:  Fall on Plavix.  Found on floor.  Initial encounter. EXAM: CT HEAD WITHOUT CONTRAST CT CERVICAL SPINE WITHOUT CONTRAST TECHNIQUE: Multidetector CT imaging of the head and cervical spine was performed following the standard protocol without intravenous contrast. Multiplanar CT image reconstructions of the cervical spine were also generated. COMPARISON:  None. FINDINGS: CT HEAD FINDINGS Skull and Sinuses:Negative for fracture. There is subtle deep soft tissue thickening at the vertex, not convincing for acute contusion. Bilateral mild chronic mastoiditis with sclerosis and opacification in the mastoid tips. Visualized orbits: No traumatic finding.  Right cataract resection Brain: No evidence of acute infarction, hemorrhage, hydrocephalus, or mass lesion/mass effect. Age congruent  generalized volume loss and small vessel ischemic change in the deep cerebral white matter. CT CERVICAL SPINE FINDINGS Negative for acute fracture or subluxation. No prevertebral edema. No gross cervical canal hematoma. Multilevel disc degeneration with occasional calcification. There is notable bulging disc at C4-5 with ventral cord contact but no suspected mass-effect. Biapical pleural  based scarring. IMPRESSION: No evidence of intracranial or cervical spine injury. Electronically Signed   By: Monte Fantasia M.D.   On: 05/20/2015 10:17   Dg Pelvis Portable  05/20/2015  CLINICAL DATA:  Status post fall this morning. EXAM: PORTABLE PELVIS 1-2 VIEWS COMPARISON:  None. FINDINGS: There is an isolated right greater trochanteric fracture. There is no other fracture or dislocation. 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 IMPRESSION: Isolated right greater trochanteric fracture on a single AP view. If there is further clinical concern, recommend dedicated right hip views or a CT of the right hip. Electronically Signed   By: Kathreen Devoid   On: 05/20/2015 10:41   Ct Hip Right Wo Contrast  05/20/2015  CLINICAL DATA:  Patient found down today. Right hip pain and greater trochanter fracture by plain films. Initial encounter. EXAM: CT OF THE RIGHT HIP WITHOUT CONTRAST TECHNIQUE: Multidetector CT imaging of the right hip was performed according to the standard protocol. Multiplanar CT image reconstructions were also generated. COMPARISON:  Title of the right hip earlier today. FINDINGS: As seen on the plain films earlier today, there is a fracture of the greater trochanter with approximately 2 cm of superior distraction. No intertrochanteric extension is identified although bones are somewhat osteopenic. The right hip is located with mild degenerative change seen. Chondrocalcinosis about the right hip and symphysis pubis is noted. Imaged intrapelvic contents demonstrate no focal abnormality. Atherosclerotic vascular  disease is noted. IMPRESSION: Right greater trochanter fracture with approximately 2 cm of superior distraction. No intertrochanteric extension is identified but the patient is osteopenic and fractures can be occult on CT scan. MRI without contrast is a more sensitive test. Electronically Signed   By: Inge Rise M.D.   On: 05/20/2015 16:08   Dg Chest Portable 1 View  05/20/2015  CLINICAL DATA:  Status post fall.  Sore. EXAM: PORTABLE CHEST 1 VIEW COMPARISON:  08/22/2013 FINDINGS: There is bilateral chronic interstitial thickening. There is no focal consolidation. There is no pleural effusion or pneumothorax. The heart and mediastinal contours are unremarkable. There is thoracic aortic atherosclerosis. There is a small hiatal hernia. The osseous structures are unremarkable. IMPRESSION: No active disease. Electronically Signed   By: Kathreen Devoid   On: 05/20/2015 10:45   Dg Hip Unilat With Pelvis 2-3 Views Right  05/20/2015  CLINICAL DATA:  Fall this morning with limited range of motion in the right leg. Initial encounter. EXAM: DG HIP (WITH OR WITHOUT PELVIS) 2-3V RIGHT COMPARISON:  None. FINDINGS: Mildly distracted greater trochanter fracture of the right femur. There is no evidence of intertrochanteric extension, but MRI would be the most sensitive method for evaluation in this patient with marked osteopenia. No evidence of pelvic ring fracture. Located right hip. IMPRESSION: Mildly distracted greater trochanter fracture on the right. No intertrochanteric extension identified, but MRI would have the highest sensitivity for fracture characterization. Electronically Signed   By: Monte Fantasia M.D.   On: 05/20/2015 12:09     Assessment/Plan   ICD-9-CM ICD-10-CM   1. Essential hypertension 401.9 I10   2. Closed fracture of trochanter of right femur with routine healing, subsequent encounter V54.13 S72.101D   3. Fall at home, subsequent encounter V58.89 W19.XXXD    E888.9    4. Basal cell carcinoma  of pretibial region, left 173.71 C44.719   5. Weakness -generalized 780.79 R53.1   6. Malnutrition of moderate degree 263.0 E44.0     Cont current meds as ordered  VS qshift x 1 week  PT/OT/ST as ordered  Nutritional supplements per facility protocol  F/u with ortho as scheduled  T/c palliative care to discuss goals . GOAL: short term rehab and d/c home when medically appropriate. Communicated with pt and nursing.  Will follow   S. Perlie Gold  [facility] [address] [place], [place] [address] [phone] Cell (Monday-Friday 8 AM - 5 PM) [phone] After 5 PM and follow prompts

## 2015-06-24 ENCOUNTER — Non-Acute Institutional Stay (SKILLED_NURSING_FACILITY): Payer: Medicare Other | Admitting: Adult Health

## 2015-06-24 DIAGNOSIS — I739 Peripheral vascular disease, unspecified: Secondary | ICD-10-CM

## 2015-06-24 DIAGNOSIS — I10 Essential (primary) hypertension: Secondary | ICD-10-CM | POA: Diagnosis not present

## 2015-06-24 DIAGNOSIS — S72101D Unspecified trochanteric fracture of right femur, subsequent encounter for closed fracture with routine healing: Secondary | ICD-10-CM | POA: Diagnosis not present

## 2015-06-24 DIAGNOSIS — N400 Enlarged prostate without lower urinary tract symptoms: Secondary | ICD-10-CM

## 2015-06-30 ENCOUNTER — Ambulatory Visit
Admission: RE | Admit: 2015-06-30 | Discharge: 2015-06-30 | Disposition: A | Discharge status: Home / Self Care | Payer: Medicare Other | Source: Ambulatory Visit | Attending: Radiation Oncology | Admitting: Radiation Oncology

## 2015-06-30 ENCOUNTER — Telehealth: Payer: Self-pay | Admitting: *Deleted

## 2015-06-30 NOTE — Telephone Encounter (Signed)
Called patient home, no answer,left vm to call had 2pm follow up appt toiday

## 2015-07-26 ENCOUNTER — Encounter: Payer: Self-pay | Admitting: Adult Health

## 2015-07-26 ENCOUNTER — Non-Acute Institutional Stay (SKILLED_NURSING_FACILITY): Payer: Medicare Other | Admitting: Adult Health

## 2015-07-26 DIAGNOSIS — S72101D Unspecified trochanteric fracture of right femur, subsequent encounter for closed fracture with routine healing: Secondary | ICD-10-CM

## 2015-07-26 DIAGNOSIS — N400 Enlarged prostate without lower urinary tract symptoms: Secondary | ICD-10-CM | POA: Diagnosis not present

## 2015-07-26 DIAGNOSIS — I10 Essential (primary) hypertension: Secondary | ICD-10-CM | POA: Diagnosis not present

## 2015-07-26 DIAGNOSIS — I739 Peripheral vascular disease, unspecified: Secondary | ICD-10-CM | POA: Diagnosis not present

## 2015-07-26 LAB — PREALBUMIN: PREALBUMIN: 17

## 2015-07-26 NOTE — Progress Notes (Signed)
Patient ID: [name], male   DOB: [birth year], 82 y.o.   MRN: [id number]   Facility: [facility]       No Known Allergies  Chief Complaint  Patient presents with  . Medical Management of Chronic Issues    Follow up    HPI:  He is a resident of this facility being seen for the management of his chronic illnesses. There is little change in his status. He unable to fully participate in the hpi or ros; he did tell me that he feels good. There are no nursing concerns at this time.     Past Medical History  Diagnosis Date  . Arthritis   . Cancer ([place])     bladder  . Hypertension   . S/P radiation therapy 02/15/2015 through 03/18/2015    Left pretibial region 4400 cGy in 10 sessions, two sessions a week for 5 weeks   . PAD (peripheral artery disease) ([place])   . PVD (peripheral vascular disease) [facility])     Past Surgical History  Procedure Laterality Date  . Achilles tendon surgery      VITAL SIGNS BP 109/66 mmHg  Pulse 64  Temp(Src) 98.9 F (37.2 C) (Oral)  Resp 16  Ht 6\' 6"  (1.981 m)  Wt 159 lb (72.122 kg)  BMI 18.38 kg/m2  SpO2 95%  Patient's Medications  New Prescriptions   No medications on file  Previous Medications   ACETAMINOPHEN (TYLENOL) 500 MG TABLET    Take 1,000 mg by mouth every 4 (four) hours as needed for moderate pain.   AMLODIPINE-BENAZEPRIL (LOTREL) 10-40 MG PER CAPSULE    Take 1 capsule by mouth daily.   BRIMONIDINE (ALPHAGAN) 0.15 % OPHTHALMIC SOLUTION    Place 1 drop into both eyes 2 (two) times daily.   BRINZOLAMIDE (AZOPT) 1 % OPHTHALMIC SUSPENSION    Place 1 drop into both eyes 3 (three) times daily.   CLOPIDOGREL (PLAVIX) 75 MG TABLET    Take 75 mg by mouth daily with breakfast.   FINASTERIDE (PROSCAR) 5 MG TABLET    Take 5 mg by mouth daily.   HYDRALAZINE (APRESOLINE) 10 MG TABLET    Take 5 mg by mouth 2 (two) times daily. TAKE 1/2 TABLET   LATANOPROST (XALATAN) 0.005 % OPHTHALMIC SOLUTION    Place 1 drop  into both eyes at bedtime.   MEGARED OMEGA-3 KRILL OIL 500 MG CAPS    Take 1 tablet by mouth daily.   MULTIPLE VITAMINS-MINERALS (ICAPS AREDS 2 PO)    Take 1 tablet by mouth. Reported on 07/26/2015   TAMSULOSIN (FLOMAX) 0.4 MG CAPS CAPSULE    Take 0.4 mg by mouth daily after supper.   TRAMADOL (ULTRAM) 50 MG TABLET    Take 1 tablet (50 mg total) by mouth every 6 (six) hours   Modified Medications   No medications on file  Discontinued Medications   No medications on file     SIGNIFICANT DIAGNOSTIC EXAMS  05-20-15: ct of head and cervical spine: No evidence of intracranial or cervical spine injury.  05-20-15: chest x-ray: No active disease.  05-20-15: ct of right hip: Right greater trochanter fracture with approximately 2 cm of superior distraction. No intertrochanteric extension is identified but the patient is osteopenic and fractures can be occult on CT scan.   LABS REVIEWED:   05-20-15: wbc 14.6; hgb 11.9; hct 36.8; mcv 98.4; plt 260; glucose 138; bun 30; creat 1.20; k+ 4.5; na++137; liver normal albumin 3.5 05-22-15: wbc 16.1; hgb  12.0; hct 36.9; mcv 97.1; plt 287; glucose 111; bun 27; creat 1.21; k+ 4.2; na++138  06-29-15: pre-albumin 17    Review of Systems  Unable to perform ROS: other  hard of hearing   Physical Exam  Constitutional: No distress.  Eyes: Conjunctivae are normal.  Neck: Neck supple. No JVD present. No thyromegaly present.  Cardiovascular: Normal rate, regular rhythm and intact distal pulses.   Respiratory: Effort normal and breath sounds normal. No respiratory distress. He has no wheezes.  GI: Soft. Bowel sounds are normal. He exhibits no distension. There is no tenderness.  Musculoskeletal: He exhibits no edema.  Able to move all extremities  Is status post right hip fracture   Lymphadenopathy:   He has no cervical adenopathy.  Neurological: He is alert.  Skin: Skin is warm and dry. He is not diaphoretic.  Psychiatric: He has a normal mood and affect.      ASSESSMENT/ PLAN:  1. Hypertension: will continue lotrel 10-40 mg daily hydralazine 5 mg twice daily and will have nursing check blood pressure twice daily for one week   2. BPH: will continue flomax daily   3. PAD: wil continue plavix 75 mg dail will monitor  4. Glaucoma: Alphagan 1 drop both eyes twice daily;  azopt to both eyes three times daily; xalatan to both eyes nightly  5. Right hip fracture: will continue therapy as directed and will follow up with orthopedics as indicated. Will continue  ultram  50 mg  every 6 hours routinely for pain management.           Ok Edwards NP [facility]  Contact [phone] Monday through Friday 8am- 5pm  After hours call [phone]

## 2015-07-28 ENCOUNTER — Non-Acute Institutional Stay (SKILLED_NURSING_FACILITY): Payer: Medicare Other | Admitting: Adult Health

## 2015-07-28 ENCOUNTER — Encounter: Payer: Self-pay | Admitting: Adult Health

## 2015-07-28 DIAGNOSIS — I10 Essential (primary) hypertension: Secondary | ICD-10-CM

## 2015-07-28 NOTE — Progress Notes (Signed)
Patient ID: [name], male   DOB: [birth year], 82 y.o.   MRN: [id number]   Facility: [facility]       No Known Allergies  Chief Complaint  Patient presents with  . Acute Visit    hypertension       HPI:  Staff reports that nearly all of his blood pressure reading have been low. There is concern that he is not tolerating his bllod pressure medications. He is unable to fully participate in the hpi or ros.    Past Medical History  Diagnosis Date  . Arthritis   . Cancer ([place])     bladder  . Hypertension   . S/P radiation therapy 02/15/2015 through 03/18/2015    Left pretibial region 4400 cGy in 10 sessions, two sessions a week for 5 weeks   . PAD (peripheral artery disease) ([place])   . PVD (peripheral vascular disease) [facility])     Past Surgical History  Procedure Laterality Date  . Achilles tendon surgery      VITAL SIGNS BP 148/74 mmHg  Pulse 70  Temp(Src) 97.4 F (36.3 C) (Oral)  Resp 18  Ht 6\' 6"  (1.981 m)  Wt 159 lb (72.122 kg)  BMI 18.38 kg/m2  SpO2 95%  Patient's Medications  New Prescriptions   No medications on file  Previous Medications   ACETAMINOPHEN (TYLENOL) 500 MG TABLET    Take 1,000 mg by mouth every 4 (four) hours as needed for moderate pain.   AMLODIPINE-BENAZEPRIL (LOTREL) 10-40 MG PER CAPSULE    Take 1 capsule by mouth daily.   BRIMONIDINE (ALPHAGAN) 0.15 % OPHTHALMIC SOLUTION    Place 1 drop into both eyes 2 (two) times daily.   BRINZOLAMIDE (AZOPT) 1 % OPHTHALMIC SUSPENSION    Place 1 drop into both eyes 3 (three) times daily.   CLOPIDOGREL (PLAVIX) 75 MG TABLET    Take 75 mg by mouth daily with breakfast.   FINASTERIDE (PROSCAR) 5 MG TABLET    Take 5 mg by mouth daily.   HYDRALAZINE (APRESOLINE) 10 MG TABLET    Take 5 mg by mouth 2 (two) times daily. TAKE 1/2 TABLET   LATANOPROST (XALATAN) 0.005 % OPHTHALMIC SOLUTION    Place 1 drop into both eyes at bedtime.   MEGARED OMEGA-3 KRILL OIL 500 MG CAPS    Take  1 tablet by mouth daily.   MULTIPLE VITAMINS-MINERALS (ICAPS AREDS 2 PO)    Take 1 tablet by mouth. Reported on 07/26/2015   TAMSULOSIN (FLOMAX) 0.4 MG CAPS CAPSULE    Take 0.4 mg by mouth daily after supper.   TRAMADOL (ULTRAM) 50 MG TABLET    Take 1 tablet (50 mg total) by mouth every 6 (six) hours   Modified Medications   No medications on file  Discontinued Medications   No medications on file     SIGNIFICANT DIAGNOSTIC EXAMS   05-20-15: ct of head and cervical spine: No evidence of intracranial or cervical spine injury.  05-20-15: chest x-ray: No active disease.  05-20-15: ct of right hip: Right greater trochanter fracture with approximately 2 cm of superior distraction. No intertrochanteric extension is identified but the patient is osteopenic and fractures can be occult on CT scan.   LABS REVIEWED:   05-20-15: wbc 14.6; hgb 11.9; hct 36.8; mcv 98.4; plt 260; glucose 138; bun 30; creat 1.20; k+ 4.5; na++137; liver normal albumin 3.5 05-22-15: wbc 16.1; hgb 12.0; hct 36.9; mcv 97.1; plt 287; glucose 111; bun 27; creat 1.21; k+  4.2; na++138  06-29-15: pre-albumin 17    Review of Systems  Unable to perform ROS: other  hard of hearing   Physical Exam  Constitutional: No distress.  Eyes: Conjunctivae are normal.  Neck: Neck supple. No JVD present. No thyromegaly present.  Cardiovascular: Normal rate, regular rhythm and intact distal pulses.   Respiratory: Effort normal and breath sounds normal. No respiratory distress. He has no wheezes.  GI: Soft. Bowel sounds are normal. He exhibits no distension. There is no tenderness.  Musculoskeletal: He exhibits no edema.  Able to move all extremities  Is status post right hip fracture   Lymphadenopathy:   He has no cervical adenopathy.  Neurological: He is alert.  Skin: Skin is warm and dry. He is not diaphoretic.  Psychiatric: He has a normal mood and affect.     ASSESSMENT/ PLAN:  1. Hypertension: will continue  hydralazine 5 mg  twice daily will stop the lotrel and will begin lisinopril 40 mg daily. Will hold all blood pressure medications for systolic pressure 123456.        Ok Edwards NP [facility] Adult Medicine  Contact [phone] Monday through Friday 8am- 5pm  After hours call [phone]

## 2015-08-01 DIAGNOSIS — I739 Peripheral vascular disease, unspecified: Secondary | ICD-10-CM | POA: Insufficient documentation

## 2015-08-01 DIAGNOSIS — N400 Enlarged prostate without lower urinary tract symptoms: Secondary | ICD-10-CM | POA: Insufficient documentation

## 2015-08-01 NOTE — Progress Notes (Signed)
Patient ID: [name], male   DOB: 1917-08-07, 82 y.o.   MRN: [id number]    [facility]       No Known Allergies  Chief Complaint  Patient presents with  . Medical Management of Chronic Issues     HPI:  He is a resident of this facility being seen for the management of his chronic illnesses. He is unable to to fully participate in the hpi or ros; but did tell me that his back hurts. There are no nursing concerns at this time.   Past Medical History  Diagnosis Date  . Arthritis   . Cancer ([place])     bladder  . Hypertension   . S/P radiation therapy 02/15/2015 through 03/18/2015    Left pretibial region 4400 cGy in 10 sessions, two sessions a week for 5 weeks   . PAD (peripheral artery disease) ([place])   . PVD (peripheral vascular disease) [facility])     Past Surgical History  Procedure Laterality Date  . Achilles tendon surgery      VITAL SIGNS BP 109/63 mmHg  Pulse 60  Temp(Src) 97.8 F (36.6 C)  Ht 5\' 10"  (1.778 m)  Wt 160 lb (72.576 kg)  BMI 22.96 kg/m2  Patient's Medications  New Prescriptions   No medications on file  Previous Medications   ACETAMINOPHEN (TYLENOL) 500 MG TABLET    Take 1,000 mg by mouth every 4 (four) hours as needed for moderate pain.   AMLODIPINE-BENAZEPRIL (LOTREL) 10-40 MG PER CAPSULE    Take 1 capsule by mouth daily.   BRIMONIDINE (ALPHAGAN) 0.15 % OPHTHALMIC SOLUTION    Place 1 drop into both eyes 2 (two) times daily.   BRINZOLAMIDE (AZOPT) 1 % OPHTHALMIC SUSPENSION    Place 1 drop into both eyes 3 (three) times daily.   CLOPIDOGREL (PLAVIX) 75 MG TABLET    Take 75 mg by mouth daily with breakfast.   FINASTERIDE (PROSCAR) 5 MG TABLET    Take 5 mg by mouth daily.   HYDRALAZINE (APRESOLINE) 10 MG TABLET    Take 5 mg by mouth 2 (two) times daily. TAKE 1/2 TABLET   LATANOPROST (XALATAN) 0.005 % OPHTHALMIC SOLUTION    Place 1 drop into both eyes at bedtime.   MEGARED OMEGA-3 KRILL OIL 500 MG CAPS     Take 1 tablet by mouth daily.   MULTIPLE VITAMINS-MINERALS (ICAPS AREDS 2 PO)    Take 1 tablet by mouth. Reported on 07/26/2015   TAMSULOSIN (FLOMAX) 0.4 MG CAPS CAPSULE    Take 0.4 mg by mouth daily after supper.   TRAMADOL (ULTRAM) 50 MG TABLET    Take 1 tablet (50 mg total) by mouth every 6 (six) hours as needed (PAIN).  Modified Medications   No medications on file  Discontinued Medications   No medications on file     SIGNIFICANT DIAGNOSTIC EXAMS  05-20-15: ct of head and cervical spine: No evidence of intracranial or cervical spine injury.  05-20-15: chest x-ray: No active disease.  05-20-15: ct of right hip: Right greater trochanter fracture with approximately 2 cm of superior distraction. No intertrochanteric extension is identified but the patient is osteopenic and fractures can be occult on CT scan.   LABS REVIEWED:   05-20-15: wbc 14.6; hgb 11.9; hct 36.8; mcv 98.4; plt 260; glucose 138; bun 30; creat 1.20; k+ 4.5; na++137; liver normal albumin 3.5 05-22-15: wbc 16.1; hgb 12.0; hct 36.9; mcv 97.1; plt 287; glucose 111; bun 27; creat 1.21; k+ 4.2;  na++138     Review of Systems  Unable to perform ROS: other  hard of hearing   Physical Exam  Constitutional: No distress.  Eyes: Conjunctivae are normal.  Neck: Neck supple. No JVD present. No thyromegaly present.  Cardiovascular: Normal rate, regular rhythm and intact distal pulses.   Respiratory: Effort normal and breath sounds normal. No respiratory distress. He has no wheezes.  GI: Soft. Bowel sounds are normal. He exhibits no distension. There is no tenderness.  Musculoskeletal: He exhibits no edema.  Able to move all extremities  Is status post right hip fracture   Lymphadenopathy:    He has no cervical adenopathy.  Neurological: He is alert.  Skin: Skin is warm and dry. He is not diaphoretic.  Psychiatric: He has a normal mood and affect.     ASSESSMENT/ PLAN:  1. Hypertension: will continue lotrel 10-40 mg daily  hydralazine 5 mg twice daily and will have nursing check blood pressure twice daily for one week   2. BPH: will continue flomax daily   3. PAD: wil continue plavix 75 mg dail will monitor  4. Glaucoma: Alphagan 1 drop both eyes twice daily;  azopt to both eyes three times daily; xalatan to both eyes nightly  5. Right hip fracture: will continue therapy as directed and will follow up with orthopedics as indicated. Will change his ultram to 50 mg to every 6 hours routinely for pain management.         Ok Edwards NP [facility] Adult Medicine  Contact [phone] Monday through Friday 8am- 5pm  After hours call [phone]

## 2015-08-12 ENCOUNTER — Ambulatory Visit: Admission: RE | Admit: 2015-08-12 | Payer: Medicare Other | Source: Ambulatory Visit | Admitting: Radiation Oncology

## 2015-08-13 ENCOUNTER — Ambulatory Visit
Admission: RE | Admit: 2015-08-13 | Discharge: 2015-08-13 | Disposition: A | Discharge status: Home / Self Care | Payer: Medicare Other | Source: Ambulatory Visit | Attending: Radiation Oncology | Admitting: Radiation Oncology

## 2015-08-13 ENCOUNTER — Ambulatory Visit: Admission: RE | Admit: 2015-08-13 | Payer: Medicare Other | Source: Ambulatory Visit | Admitting: Radiation Oncology

## 2015-08-25 ENCOUNTER — Non-Acute Institutional Stay (SKILLED_NURSING_FACILITY): Payer: Medicare Other | Admitting: Adult Health

## 2015-08-25 ENCOUNTER — Encounter: Payer: Self-pay | Admitting: Adult Health

## 2015-08-25 DIAGNOSIS — N400 Enlarged prostate without lower urinary tract symptoms: Secondary | ICD-10-CM

## 2015-08-25 DIAGNOSIS — S72101D Unspecified trochanteric fracture of right femur, subsequent encounter for closed fracture with routine healing: Secondary | ICD-10-CM

## 2015-08-25 DIAGNOSIS — I739 Peripheral vascular disease, unspecified: Secondary | ICD-10-CM

## 2015-08-25 DIAGNOSIS — I10 Essential (primary) hypertension: Secondary | ICD-10-CM | POA: Diagnosis not present

## 2015-08-25 NOTE — Progress Notes (Signed)
Patient ID: [name], male   DOB: 14-Sep-1917, 82 y.o.   MRN: [id number]   [facility]       No Known Allergies  Chief Complaint  Patient presents with  . Medical Management of Chronic Issues    Follow up    HPI:  He is a long term resident of this facility being seen for the management of his chronic illnesses. Overall there is little change in his status. He is unable to fully participate in the hpi or ros but did deny pain to me this AM. There are no nursing concerns at this time.    Past Medical History  Diagnosis Date  . Arthritis   . Cancer ([place])     bladder  . Hypertension   . S/P radiation therapy 02/15/2015 through 03/18/2015    Left pretibial region 4400 cGy in 10 sessions, two sessions a week for 5 weeks   . PAD (peripheral artery disease) ([place])   . PVD (peripheral vascular disease) [facility])     Past Surgical History  Procedure Laterality Date  . Achilles tendon surgery      VITAL SIGNS BP 140/78 mmHg  Pulse 76  Temp(Src) 97.8 F (36.6 C) (Oral)  Resp 18  Ht 6\' 6"  (1.981 m)  Wt 154 lb 6 oz (70.024 kg)  BMI 17.84 kg/m2  SpO2 96%  Patient's Medications  New Prescriptions   No medications on file  Previous Medications   ACETAMINOPHEN (TYLENOL) 500 MG TABLET    Take 1,000 mg by mouth every 4 (four) hours as needed for moderate pain.   BRIMONIDINE (ALPHAGAN) 0.15 % OPHTHALMIC SOLUTION    Place 1 drop into both eyes 2 (two) times daily.   BRINZOLAMIDE (AZOPT) 1 % OPHTHALMIC SUSPENSION    Place 1 drop into both eyes 3 (three) times daily.   CLOPIDOGREL (PLAVIX) 75 MG TABLET    Take 75 mg by mouth daily with breakfast.   FINASTERIDE (PROSCAR) 5 MG TABLET    Take 5 mg by mouth daily.   HYDRALAZINE (APRESOLINE) 10 MG TABLET    Take 5 mg by mouth 2 (two) times daily. TAKE 1/2 TABLET   LATANOPROST (XALATAN) 0.005 % OPHTHALMIC SOLUTION    Place 1 drop into both eyes at bedtime.   LISINOPRIL (PRINIVIL,ZESTRIL) 40 MG  TABLET    Take 40 mg by mouth daily.   MEGARED OMEGA-3 KRILL OIL 500 MG CAPS    Take 1 tablet by mouth daily.   MULTIPLE VITAMINS-MINERALS (ICAPS AREDS 2 PO)    Take 1 tablet by mouth. Reported on 07/26/2015   TAMSULOSIN (FLOMAX) 0.4 MG CAPS CAPSULE    Take 0.4 mg by mouth daily after supper.   TRAMADOL (ULTRAM) 50 MG TABLET    Take 1 tablet (50 mg total) by mouth every 6 (six) hours as needed (PAIN).  Modified Medications   No medications on file  Discontinued Medications     SIGNIFICANT DIAGNOSTIC EXAMS  05-20-15: ct of head and cervical spine: No evidence of intracranial or cervical spine injury.  05-20-15: chest x-ray: No active disease.  05-20-15: ct of right hip: Right greater trochanter fracture with approximately 2 cm of superior distraction. No intertrochanteric extension is identified but the patient is osteopenic and fractures can be occult on CT scan.   LABS REVIEWED:   05-20-15: wbc 14.6; hgb 11.9; hct 36.8; mcv 98.4; plt 260; glucose 138; bun 30; creat 1.20; k+ 4.5; na++137; liver normal albumin 3.5 05-22-15: wbc 16.1; hgb  12.0; hct 36.9; mcv 97.1; plt 287; glucose 111; bun 27; creat 1.21; k+ 4.2; na++138  06-29-15: pre-albumin 17    Review of Systems  Unable to perform ROS: other  hard of hearing   Physical Exam  Constitutional: No distress.  Eyes: Conjunctivae are normal.  Neck: Neck supple. No JVD present. No thyromegaly present.  Cardiovascular: Normal rate, regular rhythm and intact distal pulses.   Respiratory: Effort normal and breath sounds normal. No respiratory distress. He has no wheezes.  GI: Soft. Bowel sounds are normal. He exhibits no distension. There is no tenderness.  Musculoskeletal: He exhibits no edema.  Able to move all extremities  Is status post right hip fracture   Lymphadenopathy:   He has no cervical adenopathy.  Neurological: He is alert. Is easily confused  Skin: Skin is warm and dry. He is not diaphoretic.  Psychiatric: He has a normal  mood and affect.     ASSESSMENT/ PLAN:  1. Hypertension: will continue lisinopril 40 m mg daily hydralazine 5 mg twice daily and will hold blood pressure medications for systolic 123456   2. BPH: will continue flomax daily   3. PAD: wil continue plavix 75 mg dail will monitor  4. Glaucoma: Alphagan 1 drop both eyes twice daily;  azopt to both eyes three times daily; xalatan to both eyes nightly  5. Right hip fracture: will continue therapy as directed and will follow up with orthopedics as indicated. Will continue  ultram  50 mg  every 6 hours as needed for pain management.       Ok Edwards NP [facility] Adult Medicine  Contact [phone] Monday through Friday 8am- 5pm  After hours call [phone]

## 2015-09-21 ENCOUNTER — Encounter: Payer: Self-pay | Admitting: Internal Medicine

## 2015-09-21 ENCOUNTER — Non-Acute Institutional Stay (SKILLED_NURSING_FACILITY): Payer: Medicare Other | Admitting: Internal Medicine

## 2015-09-21 DIAGNOSIS — R531 Weakness: Secondary | ICD-10-CM

## 2015-09-21 DIAGNOSIS — N400 Enlarged prostate without lower urinary tract symptoms: Secondary | ICD-10-CM

## 2015-09-21 DIAGNOSIS — E440 Moderate protein-calorie malnutrition: Secondary | ICD-10-CM | POA: Diagnosis not present

## 2015-09-21 DIAGNOSIS — S72101D Unspecified trochanteric fracture of right femur, subsequent encounter for closed fracture with routine healing: Secondary | ICD-10-CM | POA: Diagnosis not present

## 2015-09-21 DIAGNOSIS — I739 Peripheral vascular disease, unspecified: Secondary | ICD-10-CM | POA: Diagnosis not present

## 2015-09-21 DIAGNOSIS — I10 Essential (primary) hypertension: Secondary | ICD-10-CM | POA: Diagnosis not present

## 2015-09-21 NOTE — Progress Notes (Signed)
DATE:  May 9th, 2017  Location:  [facility] Room Number: 140-P Place of Service: SNF [phone])   Extended Emergency Contact Information Primary Emergency Contact: [place] of [place] Phone: [phone] Relation: Daughter  Advanced Directive information  FULL CODE  Chief Complaint  Patient presents with  . Medical Management of Chronic Issues    Routine Visit    HPI:  82 yo male seen today for f/u. He c/o soreness in right hip but pain controlled. Tolerating tx. No falls. No nursing issues. Appetite ok and sleeping well. Albumin 3.5 in Jan 2017. Weight down about 20 lbs since admission to SNF but up 2 lbs since last month Unable to obtain further HPI as pt is very HOH. Hx obtained from chart  Hypertension - BP controlled on lisinopril 40 m mg daily; hydralazine 5 mg twice daily. hold blood pressure medications for systolic 123456   BPH - stable on flomax daily   PAD - stable on plavix 75 mg daily  Glaucoma - stable on Alphagan 1 drop both eyes twice daily;  azopt to both eyes three times daily; xalatan to both eyes nightly  Right hip fracture - routine healing. Followed by Ortho. pain stable on ultram  50 mg  every 6 hours prn   Past Medical History  Diagnosis Date  . Arthritis   . Cancer ([place])     bladder  . Hypertension   . S/P radiation therapy 02/15/2015 through 03/18/2015    Left pretibial region 4400 cGy in 10 sessions, two sessions a week for 5 weeks   . PAD (peripheral artery disease) ([place])   . PVD (peripheral vascular disease) [facility])     Past Surgical History  Procedure Laterality Date  . Achilles tendon surgery      Patient Care Team: [name], MD as PCP - General (Internal Medicine)  Social History   Social History  . Marital Status: Married    Spouse Name: N/A  . Number of Children: N/A  . Years of Education: N/A   Occupational History  . Not on file.    Social History Main Topics  . Smoking status: Current Some Day Smoker    Types: Pipe  . Smokeless tobacco: Never Used  . Alcohol Use: 1.2 oz/week    2 Glasses of wine per week  . Drug Use: No  . Sexual Activity: No   Other Topics Concern  . Not on file   Social History Narrative     reports that he has been smoking Pipe.  He has never used smokeless tobacco. He reports that he drinks about 1.2 oz of alcohol per week. He reports that he does not use illicit drugs.  Immunization History  Administered Date(s) Administered  . Influenza-Unspecified 02/13/2015  . Pneumococcal Polysaccharide-23 08/23/2013    No Known Allergies  Medications: Patient's Medications  New Prescriptions   No medications on file  Previous Medications   ACETAMINOPHEN (TYLENOL) 500 MG TABLET    Take 1,000 mg by mouth every 8 (eight) hours as needed for moderate pain.    BENAZEPRIL (LOTENSIN) 40 MG TABLET    Take 40 mg by mouth daily.   BRIMONIDINE (ALPHAGAN) 0.15 % OPHTHALMIC SOLUTION    Place 1 drop into both eyes 2 (two) times daily.   BRINZOLAMIDE (AZOPT) 1 % OPHTHALMIC SUSPENSION    Place 1 drop into both eyes 3 (three) times daily.   CLOPIDOGREL (PLAVIX) 75 MG TABLET  Take 75 mg by mouth daily with breakfast.   FINASTERIDE (PROSCAR) 5 MG TABLET    Take 5 mg by mouth daily.   HYDRALAZINE (APRESOLINE) 10 MG TABLET    Take 5 mg by mouth 2 (two) times daily. TAKE 1/2 TABLET   LATANOPROST (XALATAN) 0.005 % OPHTHALMIC SOLUTION    Place 1 drop into both eyes at bedtime.   LISINOPRIL (PRINIVIL,ZESTRIL) 40 MG TABLET    Take 40 mg by mouth daily.   MEGARED OMEGA-3 KRILL OIL 500 MG CAPS    Take 1 tablet by mouth daily.   MULTIPLE VITAMINS-MINERALS (ICAPS AREDS 2 PO)    Take 1 tablet by mouth. Reported on 07/26/2015   TAMSULOSIN (FLOMAX) 0.4 MG CAPS CAPSULE    Take 0.4 mg by mouth daily after supper.   TRAMADOL (ULTRAM) 50 MG TABLET    Take 1 tablet (50 mg total) by mouth every 6 (six) hours as needed (PAIN).   Modified Medications   No medications on file  Discontinued Medications   No medications on file    Review of Systems  Unable to perform ROS: Other  very HOH  Filed Vitals:   09/21/15 1115  BP: 129/70  Pulse: 97  Temp: 97.6 F (36.4 C)  TempSrc: Oral  Resp: 18  Height: 6\' 6"  (1.981 m)  Weight: 156 lb 4.8 oz (70.897 kg)  SpO2: 96%   Body mass index is 18.07 kg/(m^2).  Physical Exam  Constitutional: He appears well-developed.  Sitting up in bed in NAD. Frail appearing. HOH  HENT:  Mouth/Throat: Oropharynx is clear and moist.  Eyes: Pupils are equal, round, and reactive to light. No scleral icterus.  Neck: Neck supple. Carotid bruit is not present.  Cardiovascular: Normal rate, regular rhythm and intact distal pulses.  Exam reveals no gallop and no friction rub.   Murmur (1/6 SEM) heard. +1 pitting LE edema R>L with chronic venous stasis changes. No calf TTP  Pulmonary/Chest: Effort normal and breath sounds normal. He has no wheezes. He has no rales. He exhibits no tenderness.  Abdominal: Soft. Bowel sounds are normal. He exhibits no distension, no abdominal bruit, no pulsatile midline mass and no mass. There is no tenderness. There is no rebound and no guarding.  Genitourinary:  Foley cath DTG dark yellow urine  Musculoskeletal: He exhibits edema and tenderness.  RLE externally rotated. No bony deformity  Lymphadenopathy:    He has no cervical adenopathy.  Neurological: He is alert.  Skin: Skin is warm and dry.  Chronic venous stasis changes b/l LE with b/l leg dressing c/d/i. Scaling feet. No ulcerations. (+) calluses  Psychiatric: He has a normal mood and affect. His behavior is normal.     Labs reviewed: Nursing Home on 09/21/2015  Component Date Value Ref Range Status  . WBC 05/22/2015 16.1   Final  . Glucose 05/22/2015 111   Final  . BUN 05/22/2015 27* 4 - 21 mg/dL Final  . Creatinine 05/22/2015 1.2  0.6 - 1.3 mg/dL Final  . Sodium 05/22/2015 138  137 -  147 mmol/L Final  . Bilirubin, Total 05/20/2015 0.8   Final  Nursing Home on 07/26/2015  Component Date Value Ref Range Status  . PREALBUMIN 06/30/2015 17   Final    No results found.   Assessment/Plan   ICD-9-CM ICD-10-CM   1. Closed fracture of trochanter of right femur with routine healing, subsequent encounter V54.13 S72.101D   2. Weakness 780.79 R53.1   3. Essential hypertension 401.9 I10  4. PAD (peripheral artery disease) (HCC) 443.9 I73.9   5. BPH (benign prostatic hyperplasia) 600.00 N40.0   6. Malnutrition of moderate degree - improved 263.0 E44.0    Nutritional supplements as indicated  Pt is medically stable on current tx plan. Continue current medications as ordered. PT/OT/ST as indicated. Will follow  Mattie Nordell S. Perlie Gold  [facility] and Adult Medicine [address] [place], [place] [address] [phone] Cell (Monday-Friday 8 AM - 5 PM) [phone] After 5 PM and follow prompts

## 2015-09-22 ENCOUNTER — Encounter: Payer: Self-pay | Admitting: Adult Health

## 2015-09-22 NOTE — Progress Notes (Signed)
Patient ID: [name], male   DOB: 02-09-1918, 82 y.o.   MRN: [id number]   Facility:  [facility]     CODE STATUS: DNR  No Known Allergies  Chief Complaint  Patient presents with  . Medical Management of Chronic Issues    Follow up    HPI:   Past Medical History  Diagnosis Date  . Arthritis   . Cancer ([place])     bladder  . Hypertension   . S/P radiation therapy 02/15/2015 through 03/18/2015    Left pretibial region 4400 cGy in 10 sessions, two sessions a week for 5 weeks   . PAD (peripheral artery disease) ([place])   . PVD (peripheral vascular disease) [facility])     Past Surgical History  Procedure Laterality Date  . Achilles tendon surgery      Social History   Social History  . Marital Status: Married    Spouse Name: N/A  . Number of Children: N/A  . Years of Education: N/A   Occupational History  . Not on file.   Social History Main Topics  . Smoking status: Current Some Day Smoker    Types: Pipe  . Smokeless tobacco: Never Used  . Alcohol Use: 1.2 oz/week    2 Glasses of wine per week  . Drug Use: No  . Sexual Activity: No   Other Topics Concern  . Not on file   Social History Narrative   Family History  Problem Relation Age of Onset  . Arthritis Mother   . Arthritis Mother   . Hearing loss Father       VITAL SIGNS BP 130/74 mmHg  Pulse 97  Temp(Src) 97.6 F (36.4 C) (Oral)  Resp 18  Ht 6\' 6"  (1.981 m)  Wt 156 lb 3 oz (70.846 kg)  BMI 18.05 kg/m2  SpO2 96%  Patient's Medications  New Prescriptions   No medications on file  Previous Medications   ACETAMINOPHEN (TYLENOL) 500 MG TABLET    Take 1,000 mg by mouth every 8 (eight) hours as needed for moderate pain.    BRIMONIDINE (ALPHAGAN) 0.15 % OPHTHALMIC SOLUTION    Place 1 drop into both eyes 2 (two) times daily.   BRINZOLAMIDE (AZOPT) 1 % OPHTHALMIC SUSPENSION    Place 1 drop into both eyes 3 (three) times daily.   CLOPIDOGREL (PLAVIX) 75 MG TABLET     Take 75 mg by mouth daily with breakfast.   FINASTERIDE (PROSCAR) 5 MG TABLET    Take 5 mg by mouth daily.   HYDRALAZINE (APRESOLINE) 10 MG TABLET    Take 5 mg by mouth 2 (two) times daily. TAKE 1/2 TABLET   LATANOPROST (XALATAN) 0.005 % OPHTHALMIC SOLUTION    Place 1 drop into both eyes at bedtime.   LISINOPRIL (PRINIVIL,ZESTRIL) 40 MG TABLET    Take 40 mg by mouth daily.   MEGARED OMEGA-3 KRILL OIL 500 MG CAPS    Take 1 tablet by mouth daily.   MULTIPLE VITAMINS-MINERALS (ICAPS AREDS 2 PO)    Take 1 tablet by mouth. Reported on 07/26/2015   TAMSULOSIN (FLOMAX) 0.4 MG CAPS CAPSULE    Take 0.4 mg by mouth daily after supper.   TRAMADOL (ULTRAM) 50 MG TABLET    Take 1 tablet (50 mg total) by mouth every 6 (six) hours as needed (PAIN).  Modified Medications   No medications on file  Discontinued Medications   BENAZEPRIL (LOTENSIN) 40 MG TABLET    Take 40 mg by mouth daily. Reported  on 09/22/2015     SIGNIFICANT DIAGNOSTIC EXAMS  05-20-15: ct of head and cervical spine: No evidence of intracranial or cervical spine injury.  05-20-15: chest x-ray: No active disease.  05-20-15: ct of right hip: Right greater trochanter fracture with approximately 2 cm of superior distraction. No intertrochanteric extension is identified but the patient is osteopenic and fractures can be occult on CT scan.   LABS REVIEWED:   05-20-15: wbc 14.6; hgb 11.9; hct 36.8; mcv 98.4; plt 260; glucose 138; bun 30; creat 1.20; k+ 4.5; na++137; liver normal albumin 3.5 05-22-15: wbc 16.1; hgb 12.0; hct 36.9; mcv 97.1; plt 287; glucose 111; bun 27; creat 1.21; k+ 4.2; na++138  06-29-15: pre-albumin 17    Review of Systems  Unable to perform ROS: other  hard of hearing   Physical Exam  Constitutional: No distress.  Eyes: Conjunctivae are normal.  Neck: Neck supple. No JVD present. No thyromegaly present.  Cardiovascular: Normal rate, regular rhythm and intact distal pulses.   Respiratory: Effort normal and breath sounds  normal. No respiratory distress. He has no wheezes.  GI: Soft. Bowel sounds are normal. He exhibits no distension. There is no tenderness.  Musculoskeletal: He exhibits no edema.  Able to move all extremities  Is status post right hip fracture   Lymphadenopathy:   He has no cervical adenopathy.  Neurological: He is alert. Is easily confused  Skin: Skin is warm and dry. He is not diaphoretic.  Psychiatric: He has a normal mood and affect.     ASSESSMENT/ PLAN:  1. Hypertension: will continue lisinopril 40 m mg daily hydralazine 5 mg twice daily and will hold blood pressure medications for systolic 123456   2. BPH: will continue flomax daily   3. PAD: wil continue plavix 75 mg dail will monitor  4. Glaucoma: Alphagan 1 drop both eyes twice daily;  azopt to both eyes three times daily; xalatan to both eyes nightly  5. Right hip fracture: will continue therapy as directed and will follow up with orthopedics as indicated. Will continue  ultram  50 mg  every 6 hours as needed for pain management.            Ok Edwards NP [facility] Adult Medicine  Contact [phone] Monday through Friday 8am- 5pm  After hours call [phone]   This encounter was created in error - please disregard.

## 2015-10-21 ENCOUNTER — Non-Acute Institutional Stay (SKILLED_NURSING_FACILITY): Payer: Medicare Other | Admitting: Adult Health

## 2015-10-21 ENCOUNTER — Encounter: Payer: Self-pay | Admitting: Adult Health

## 2015-10-21 DIAGNOSIS — I739 Peripheral vascular disease, unspecified: Secondary | ICD-10-CM

## 2015-10-21 DIAGNOSIS — S72101D Unspecified trochanteric fracture of right femur, subsequent encounter for closed fracture with routine healing: Secondary | ICD-10-CM

## 2015-10-21 DIAGNOSIS — N400 Enlarged prostate without lower urinary tract symptoms: Secondary | ICD-10-CM | POA: Diagnosis not present

## 2015-10-21 DIAGNOSIS — E440 Moderate protein-calorie malnutrition: Secondary | ICD-10-CM

## 2015-10-21 DIAGNOSIS — I10 Essential (primary) hypertension: Secondary | ICD-10-CM | POA: Diagnosis not present

## 2015-11-02 ENCOUNTER — Encounter: Payer: Self-pay | Admitting: Internal Medicine

## 2015-11-02 ENCOUNTER — Non-Acute Institutional Stay (SKILLED_NURSING_FACILITY): Payer: Medicare Other | Admitting: Internal Medicine

## 2015-11-02 DIAGNOSIS — E440 Moderate protein-calorie malnutrition: Secondary | ICD-10-CM | POA: Diagnosis not present

## 2015-11-02 DIAGNOSIS — R059 Cough, unspecified: Secondary | ICD-10-CM

## 2015-11-02 DIAGNOSIS — R05 Cough: Secondary | ICD-10-CM

## 2015-11-02 DIAGNOSIS — R062 Wheezing: Secondary | ICD-10-CM | POA: Diagnosis not present

## 2015-11-02 DIAGNOSIS — I10 Essential (primary) hypertension: Secondary | ICD-10-CM | POA: Diagnosis not present

## 2015-11-02 NOTE — Progress Notes (Signed)
DATE: 11/02/15  Location:  [facility] Room Number: 140 A Place of Service: SNF (31)   Extended Emergency Contact Information Primary Emergency Contact: [place] of [place] Phone: [phone] Relation: Daughter  Advanced Directive information Does patient have an advance directive?: Yes, Type of Advance Directive: Out of facility DNR (pink MOST or yellow form)  Chief Complaint  Patient presents with  . Cough    HPI:  82 yo male long term resident seen today for cough since yesterday evening. Nonproductive. No CP. Per nursing, O2 sats in 90s%. Not febrile. Pt is a poor historian  due to hearing loss and uncooperative today. He states "I'm busy and don't have time to talk to you". Hx obtained from chart and nursing  Moderate malnutrition - last prealbumin 17. He is on MVI daily. Weight is 159lbs (up 3 lbs since last month)  Past Medical History  Diagnosis Date  . Arthritis   . Cancer ([place])     bladder  . Hypertension   . S/P radiation therapy 02/15/2015 through 03/18/2015    Left pretibial region 4400 cGy in 10 sessions, two sessions a week for 5 weeks   . PAD (peripheral artery disease) ([place])   . PVD (peripheral vascular disease) [facility])     Past Surgical History  Procedure Laterality Date  . Achilles tendon surgery      Patient Care Team: [name], MD as PCP - General (Internal Medicine)  Social History   Social History  . Marital Status: Married    Spouse Name: N/A  . Number of Children: N/A  . Years of Education: N/A   Occupational History  . Not on file.   Social History Main Topics  . Smoking status: Current Some Day Smoker    Types: Pipe  . Smokeless tobacco: Never Used  . Alcohol Use: 1.2 oz/week    2 Glasses of wine per week  . Drug Use: No  . Sexual Activity: No   Other Topics Concern  . Not on file   Social History Narrative     reports that he has  been smoking Pipe.  He has never used smokeless tobacco. He reports that he drinks about 1.2 oz of alcohol per week. He reports that he does not use illicit drugs.  Family History  Problem Relation Age of Onset  . Arthritis Mother   . Arthritis Mother   . Hearing loss Father    No family status information on file.    Immunization History  Administered Date(s) Administered  . Influenza-Unspecified 02/13/2015  . Pneumococcal Polysaccharide-23 08/23/2013    No Known Allergies  Medications: Patient's Medications  New Prescriptions   No medications on file  Previous Medications   ACETAMINOPHEN (TYLENOL) 500 MG TABLET    Take 1,000 mg by mouth every 8 (eight) hours as needed for moderate pain.    BRIMONIDINE (ALPHAGAN) 0.15 % OPHTHALMIC SOLUTION    Place 1 drop into both eyes 2 (two) times daily.   BRINZOLAMIDE (AZOPT) 1 % OPHTHALMIC SUSPENSION    Place 1 drop into both eyes 3 (three) times daily.   CLOPIDOGREL (PLAVIX) 75 MG TABLET    Take 75 mg by mouth daily with breakfast.   FINASTERIDE (PROSCAR) 5 MG TABLET    Take 5 mg by mouth daily.   HYDRALAZINE (APRESOLINE) 10 MG TABLET    Take 5 mg by mouth 2 (two) times daily. TAKE 1/2 TABLET   LATANOPROST (  XALATAN) 0.005 % OPHTHALMIC SOLUTION    Place 1 drop into both eyes at bedtime.   LISINOPRIL (PRINIVIL,ZESTRIL) 40 MG TABLET    Take 40 mg by mouth daily.   MEGARED OMEGA-3 KRILL OIL 500 MG CAPS    Take 1 tablet by mouth daily.   MULTIPLE VITAMINS-MINERALS (ICAPS AREDS 2 PO)    Take 1 tablet by mouth. Reported on 07/26/2015   TAMSULOSIN (FLOMAX) 0.4 MG CAPS CAPSULE    Take 0.4 mg by mouth daily after supper.   TRAMADOL (ULTRAM) 50 MG TABLET    Take 1 tablet (50 mg total) by mouth every 6 (six) hours as needed (PAIN).  Modified Medications   No medications on file  Discontinued Medications   No medications on file    Review of Systems  Unable to perform ROS: Other  uncooperative; HOH  Filed Vitals:   11/02/15 1017  BP: 145/72    Pulse: 70  Temp: 97 F (36.1 C)  TempSrc: Oral  Resp: 18  Height: 6\' 6"  (1.981 m)  Weight: 159 lb (72.122 kg)   Body mass index is 18.38 kg/(m^2).  Physical Exam  Constitutional: He appears well-developed. No distress.  Frail appearing sitting in w/c in NAD. No conversational dyspnea  HENT:  Refused exam  Cardiovascular:  Murmur (1/6 SEM) heard. Trace LE edema b/l.  Pulmonary/Chest: Effort normal. No respiratory distress. He has wheezes (R>L end expiratory wheezing with prolonged expiratory phase). He has no rales.  Musculoskeletal: He exhibits edema.  Neurological: He is alert.  Skin: Skin is warm and dry. No rash noted.  Psychiatric: His speech is normal. His affect is angry. He is agitated and aggressive.     Labs reviewed: Nursing Home on 09/21/2015  Component Date Value Ref Range Status  . WBC 05/22/2015 16.1   Final  . Glucose 05/22/2015 111   Final  . BUN 05/22/2015 27* 4 - 21 mg/dL Final  . Creatinine 05/22/2015 1.2  0.6 - 1.3 mg/dL Final  . Sodium 05/22/2015 138  137 - 147 mmol/L Final  . Bilirubin, Total 05/20/2015 0.8   Final    No results found.   Assessment/Plan   ICD-9-CM ICD-10-CM   1. Cough - r/o pneumonia 786.2 R05   2. Wheezing 786.07 R06.2   3. Malnutrition of moderate degree 263.0 E44.0   4. Essential hypertension 401.9 I10     Start duonebs via neb TID x 1 week -->BID x 1 week-->daily x 1 week and stop  check CXR to r/o pneumonia  cotn other meds as ordered  nutritional supplements as indicated  Will follow  Carthel Castille S. Perlie Gold  [facility] and Adult Medicine [address] [place], [place] [address] [phone] Cell (Monday-Friday 8 AM - 5 PM) [phone] After 5 PM and follow prompts

## 2015-11-10 NOTE — Progress Notes (Signed)
Location:  [facility] Room Number: 140 A Place of Service:  SNF (31)   CODE STATUS: full code   No Known Allergies  Chief Complaint  Patient presents with  . Medical Management of Chronic Issues    Routine Visit    HPI:  He is a long term resident of this facility being seen for the management of his chronic illnesses. He is unable to fully participate in the hpi or ros. His weight is 159 pounds. He does get out bed daily. There are no nursing concerns at this time.    Past Medical History  Diagnosis Date  . Arthritis   . Cancer ([place])     bladder  . Hypertension   . S/P radiation therapy 02/15/2015 through 03/18/2015    Left pretibial region 4400 cGy in 10 sessions, two sessions a week for 5 weeks   . PAD (peripheral artery disease) ([place])   . [facility]VD (peripheral vascular disease) [facility])     Past Surgical History  Procedure Laterality Date  . Achilles tendon surgery      Social History   Social History  . Marital Status: Married    Spouse Name: N/A  . Number of Children: N/A  . Years of Education: N/A   Occupational History  . Not on file.   Social History Main Topics  . Smoking status: Current Some Day Smoker    Types: Pipe  . Smokeless tobacco: Never Used  . Alcohol Use: 1.2 oz/week    2 Glasses of wine per week  . Drug Use: No  . Sexual Activity: No   Other Topics Concern  . Not on file   Social History Narrative   Family History  Problem Relation Age of Onset  . Arthritis Mother   . Arthritis Mother   . Hearing loss Father       VITAL SIGNS BP 148/80 mmHg  Pulse 78  Temp(Src) 97.4 F (36.3 C) (Oral)  Resp 18  Ht 6\' 6"  (1.981 m)  Wt 159 lb (72.122 kg)  BMI 18.38 kg/m2  Patient's Medications  New Prescriptions   No medications on file  Previous Medications   ACETAMINOPHEN (TYLENOL) 500 MG TABLET    Take 1,000 mg by mouth every 8 (eight) hours as needed for moderate pain.    BRIMONIDINE (ALPHAGAN) 0.15 % OPHTHALMIC SOLUTION    Place 1 drop into both eyes 2 (two) times daily.   BRINZOLAMIDE (AZOPT) 1 % OPHTHALMIC SUSPENSION    Place 1 drop into both eyes 3 (three) times daily.   CLOPIDOGREL (PLAVIX) 75 MG TABLET    Take 75 mg by mouth daily with breakfast.   FINASTERIDE (PROSCAR) 5 MG TABLET    Take 5 mg by mouth daily.   HYDRALAZINE (APRESOLINE) 10 MG TABLET    Take 5 mg by mouth 2 (two) times daily. TAKE 1/2 TABLET   LATANOPROST (XALATAN) 0.005 % OPHTHALMIC SOLUTION    Place 1 drop into both eyes at bedtime.   LISINOPRIL (PRINIVIL,ZESTRIL) 40 MG TABLET    Take 40 mg by mouth daily.   MEGARED OMEGA-3 KRILL OIL 500 MG CAPS    Take 1 tablet by mouth daily.   MULTIPLE VITAMINS-MINERALS (ICAPS AREDS 2 PO)    Take 1 tablet by mouth. Reported on 07/26/2015   TAMSULOSIN (FLOMAX) 0.4 MG CAPS CAPSULE    Take 0.4 mg by mouth daily after supper.   TRAMADOL (ULTRAM) 50 MG TABLET    Take  1 tablet (50 mg total) by mouth every 6 (six) hours as needed (PAIN).  Modified Medications   No medications on file  Discontinued Medications   No medications on file     SIGNIFICANT DIAGNOSTIC EXAMS   05-20-15: ct of head and cervical spine: No evidence of intracranial or cervical spine injury.  05-20-15: chest x-ray: No active disease.  05-20-15: ct of right hip: Right greater trochanter fracture with approximately 2 cm of superior distraction. No intertrochanteric extension is identified but the patient is osteopenic and fractures can be occult on CT scan.   LABS REVIEWED:   05-20-15: wbc 14.6; hgb 11.9; hct 36.8; mcv 98.4; plt 260; glucose 138; bun 30; creat 1.20; k+ 4.5; na++137; liver normal albumin 3.5 05-22-15: wbc 16.1; hgb 12.0; hct 36.9; mcv 97.1; plt 287; glucose 111; bun 27; creat 1.21; k+ 4.2; na++138  06-29-15: pre-albumin 17    Review of Systems  Unable to perform ROS: other  hard of hearing   Physical Exam  Constitutional: No distress.  Eyes: Conjunctivae are normal.    Neck: Neck supple. No JVD present. No thyromegaly present.  Cardiovascular: Normal rate, regular rhythm and intact distal pulses.   Respiratory: Effort normal and breath sounds normal. No respiratory distress. He has no wheezes.  GI: Soft. Bowel sounds are normal. He exhibits no distension. There is no tenderness.  Musculoskeletal: He exhibits no edema.  Able to move all extremities  Is status post right hip fracture is externally rotated    Lymphadenopathy:   He has no cervical adenopathy.  Neurological: He is alert. Is easily confused  Skin: Skin is warm and dry. He is not diaphoretic.  Has venous changes in lower extremities Psychiatric: He has a normal mood and affect.     ASSESSMENT/ PLAN:  1. Hypertension: will continue lisinopril 40 m mg daily hydralazine 5 mg twice daily and will hold blood pressure medications for systolic 123456   2. BPH: will continue flomax daily   3. PAD: wil continue plavix 75 mg dail will monitor  4. Glaucoma: Alphagan 1 drop both eyes twice daily;  azopt to both eyes three times daily; xalatan to both eyes nightly  5. Right hip fracture:   Will continue  ultram  50 mg  every 6 hours as needed for pain management.          Ok Edwards NP [facility] Adult Medicine  Contact [phone] Monday through Friday 8am- 5pm  After hours call [phone]

## 2015-11-22 ENCOUNTER — Non-Acute Institutional Stay (SKILLED_NURSING_FACILITY): Payer: Medicare Other | Admitting: Adult Health

## 2015-11-22 ENCOUNTER — Encounter: Payer: Self-pay | Admitting: Adult Health

## 2015-11-22 DIAGNOSIS — N400 Enlarged prostate without lower urinary tract symptoms: Secondary | ICD-10-CM

## 2015-11-22 DIAGNOSIS — E440 Moderate protein-calorie malnutrition: Secondary | ICD-10-CM

## 2015-11-22 DIAGNOSIS — I10 Essential (primary) hypertension: Secondary | ICD-10-CM | POA: Insufficient documentation

## 2015-11-22 DIAGNOSIS — S72101D Unspecified trochanteric fracture of right femur, subsequent encounter for closed fracture with routine healing: Secondary | ICD-10-CM | POA: Diagnosis not present

## 2015-11-22 DIAGNOSIS — I739 Peripheral vascular disease, unspecified: Secondary | ICD-10-CM

## 2015-11-22 MED ORDER — LISINOPRIL 20 MG PO TABS: 20.0000 mg | ORAL_TABLET | Freq: Every day | ORAL | Status: AC

## 2015-11-22 NOTE — Progress Notes (Signed)
Patient ID: [name], male   DOB: Nov 09, 1917, 82 y.o.   MRN: [id number]   Location:   [place] Room Number: 140-A Place of Service:  SNF (31)   CODE STATUS: DNR  No Known Allergies  Chief Complaint  Patient presents with  . Medical Management of Chronic Issues    Follow up    HPI:  He is a long term resident of facility being seen for the management of his chronic illnesses. Overall there is little change in his status. He cannot fully participate in the hpi or ros; but did tell me that he was feeling good. There are no nursing concerns at this time.   Past Medical History  Diagnosis Date  . Arthritis   . Cancer ([place])     bladder  . Hypertension   . S/P radiation therapy 02/15/2015 through 03/18/2015    Left pretibial region 4400 cGy in 10 sessions, two sessions a week for 5 weeks   . PAD (peripheral artery disease) ([place])   . PVD (peripheral vascular disease) [facility])     Past Surgical History  Procedure Laterality Date  . Achilles tendon surgery      Social History   Social History  . Marital Status: Married    Spouse Name: N/A  . Number of Children: N/A  . Years of Education: N/A   Occupational History  . Not on file.   Social History Main Topics  . Smoking status: Current Some Day Smoker    Types: Pipe  . Smokeless tobacco: Never Used  . Alcohol Use: 1.2 oz/week    2 Glasses of wine per week  . Drug Use: No  . Sexual Activity: No   Other Topics Concern  . Not on file   Social History Narrative   Family History  Problem Relation Age of Onset  . Arthritis Mother   . Arthritis Mother   . Hearing loss Father       VITAL SIGNS BP 99/65 mmHg  Pulse 54  Temp(Src) 97.4 F (36.3 C) (Oral)  Resp 18  Ht 6' (1.829 m)  Wt 156 lb 2 oz (70.818 kg)  BMI 21.17 kg/m2  SpO2 95%  Patient's Medications  New Prescriptions   No medications on file  Previous Medications   ACETAMINOPHEN (TYLENOL) 500 MG  TABLET    Take 1,000 mg by mouth every 8 (eight) hours as needed for moderate pain.    BRIMONIDINE (ALPHAGAN) 0.15 % OPHTHALMIC SOLUTION    Place 1 drop into both eyes 2 (two) times daily.   BRINZOLAMIDE (AZOPT) 1 % OPHTHALMIC SUSPENSION    Place 1 drop into both eyes 3 (three) times daily.   CLOPIDOGREL (PLAVIX) 75 MG TABLET    Take 75 mg by mouth daily with breakfast.   FINASTERIDE (PROSCAR) 5 MG TABLET    Take 5 mg by mouth daily.   HYDRALAZINE (APRESOLINE) 10 MG TABLET    Take 5 mg by mouth 2 (two) times daily. TAKE 1/2 TABLET   IPRATROPIUM-ALBUTEROL (DUONEB) 0.5-2.5 (3) MG/3ML SOLN    Take 3 mLs by nebulization daily.   LATANOPROST (XALATAN) 0.005 % OPHTHALMIC SOLUTION    Place 1 drop into both eyes at bedtime.   LISINOPRIL (PRINIVIL,ZESTRIL) 40 MG TABLET    Take 40 mg by mouth daily.   MEGARED OMEGA-3 KRILL OIL 500 MG CAPS    Take 1 tablet by mouth daily.   MULTIPLE VITAMINS-MINERALS (ICAPS AREDS 2 PO)    Take 1  tablet by mouth. Reported on 07/26/2015   TAMSULOSIN (FLOMAX) 0.4 MG CAPS CAPSULE    Take 0.4 mg by mouth daily after supper.   TRAMADOL (ULTRAM) 50 MG TABLET    Take 1 tablet (50 mg total) by mouth every 6 (six) hours as needed (PAIN).  Modified Medications   No medications on file  Discontinued Medications   No medications on file     SIGNIFICANT DIAGNOSTIC EXAMS  05-20-15: ct of head and cervical spine: No evidence of intracranial or cervical spine injury.  05-20-15: chest x-ray: No active disease.  05-20-15: ct of right hip: Right greater trochanter fracture with approximately 2 cm of superior distraction. No intertrochanteric extension is identified but the patient is osteopenic and fractures can be occult on CT scan.   LABS REVIEWED:   05-20-15: wbc 14.6; hgb 11.9; hct 36.8; mcv 98.4; plt 260; glucose 138; bun 30; creat 1.20; k+ 4.5; na++137; liver normal albumin 3.5 05-22-15: wbc 16.1; hgb 12.0; hct 36.9; mcv 97.1; plt 287; glucose 111; bun 27; creat 1.21; k+ 4.2; na++138    06-29-15: pre-albumin 17    Review of Systems  Unable to perform ROS: other  hard of hearing   Physical Exam  Constitutional: No distress.  Eyes: Conjunctivae are normal.  Neck: Neck supple. No JVD present. No thyromegaly present.  Cardiovascular: Normal rate, regular rhythm and intact distal pulses.   Respiratory: Effort normal and breath sounds normal. No respiratory distress. He has no wheezes.  GI: Soft. Bowel sounds are normal. He exhibits no distension. There is no tenderness.  Musculoskeletal: He exhibits no edema.  Able to move all extremities  Is status post right hip fracture Jan 2017  is externally rotated    Lymphadenopathy:   He has no cervical adenopathy.  Neurological: He is alert. Is easily confused  Skin: Skin is warm and dry. He is not diaphoretic.  Has venous changes in lower extremities Multiple bruises on bilateral arms Psychiatric: He has a normal mood and affect.     ASSESSMENT/ PLAN:  1. Hypertension: will continue  hydralazine 5 mg twice daily and will hold blood pressure medications for systolic 123456  Will lower lisinopril to 20 mg daily   2. BPH: will continue flomax daily and proscar daily   3. PAD: will continue plavix 75 mg daily will monitor  4. Glaucoma: Alphagan 1 drop both eyes twice daily;  azopt to both eyes three times daily; xalatan to both eyes nightly  5. Right hip fracture: (jan 2017)   Will continue  ultram  50 mg  every 6 hours as needed for pain management.   6. Malnutrition: pre-albumin was 17; will continue supplements per facility protocol; current weight is 156 pounds    Will check cbc; cmp     Ok Edwards NP [facility]  Contact [phone] Monday through Friday 8am- 5pm  After hours call [phone]

## 2015-11-24 LAB — CBC AND DIFFERENTIAL
HEMATOCRIT: 40 % — AB (ref 41–53)
Hemoglobin: 13.4 g/dL — AB (ref 13.5–17.5)
PLATELETS: 279 10*3/uL (ref 150–399)
WBC: 9 10^3/mL

## 2015-11-24 LAB — BASIC METABOLIC PANEL
BUN: 19 mg/dL (ref 4–21)
Creatinine: 0.9 mg/dL (ref 0.6–1.3)
GLUCOSE: 75 mg/dL
Potassium: 4.4 mmol/L (ref 3.4–5.3)
SODIUM: 140 mmol/L (ref 137–147)

## 2015-11-24 LAB — HEPATIC FUNCTION PANEL
ALT: 14 U/L (ref 10–40)
AST: 18 U/L (ref 14–40)
Alkaline Phosphatase: 66 U/L (ref 25–125)
BILIRUBIN, TOTAL: 0.5 mg/dL

## 2015-12-27 ENCOUNTER — Encounter: Payer: Self-pay | Admitting: Adult Health

## 2015-12-27 ENCOUNTER — Non-Acute Institutional Stay (SKILLED_NURSING_FACILITY): Payer: Medicare Other | Admitting: Adult Health

## 2015-12-27 DIAGNOSIS — S72101D Unspecified trochanteric fracture of right femur, subsequent encounter for closed fracture with routine healing: Secondary | ICD-10-CM

## 2015-12-27 DIAGNOSIS — E440 Moderate protein-calorie malnutrition: Secondary | ICD-10-CM | POA: Diagnosis not present

## 2015-12-27 DIAGNOSIS — H409 Unspecified glaucoma: Secondary | ICD-10-CM | POA: Diagnosis not present

## 2015-12-27 DIAGNOSIS — I10 Essential (primary) hypertension: Secondary | ICD-10-CM

## 2015-12-27 DIAGNOSIS — I739 Peripheral vascular disease, unspecified: Secondary | ICD-10-CM

## 2015-12-27 DIAGNOSIS — N400 Enlarged prostate without lower urinary tract symptoms: Secondary | ICD-10-CM

## 2015-12-27 NOTE — Progress Notes (Signed)
Patient ID: [name], male   DOB: 04-Nov-1917, 82 y.o.   MRN: [id number]   Location:   [place] Room Number: 140-A Place of Service:  SNF (31)   CODE STATUS: DNR  No Known Allergies  Chief Complaint  Patient presents with  . Medical Management of Chronic Issues    Follow up    HPI:  [name] is a long term resident of this facility being seen for the management of his chronic illnesses. [name] is unable to fully participate in the hpi or ros; but did tell me that [name] was feeling good. His current weight is 151 pounds down from 156 pounds last month. [name] continues on house supplements. There are no nursing concerns at this time.   Past Medical History:  Diagnosis Date  . Arthritis   . Cancer ([place])    bladder  . Hypertension   . PAD (peripheral artery disease) ([place])   . PVD (peripheral vascular disease) ([place])   . S/P radiation therapy 02/15/2015 through 03/18/2015   Left pretibial region 4400 cGy in 10 sessions, two sessions a week for 5 weeks     Past Surgical History:  Procedure Laterality Date  . ACHILLES TENDON SURGERY      Social History   Social History  . Marital status: Married    Spouse name: N/A  . Number of children: N/A  . Years of education: N/A   Occupational History  . Not on file.   Social History Main Topics  . Smoking status: Current Some Day Smoker    Types: Pipe  . Smokeless tobacco: Never Used  . Alcohol use 1.2 oz/week    2 Glasses of wine per week  . Drug use: No  . Sexual activity: No   Other Topics Concern  . Not on file   Social History Narrative  . No narrative on file   Family History  Problem Relation Age of Onset  . Arthritis Mother   . Hearing loss Father       VITAL SIGNS BP 117/76   Pulse 67   Temp (!) 96.6 F (35.9 C) (Oral)   Resp 18   Ht 6' (1.829 m)   Wt 151 lb 6 oz (68.7 kg)   SpO2 97%   BMI 20.53 kg/m   Patient's Medications  New Prescriptions   No medications  on file  Previous Medications   ACETAMINOPHEN (TYLENOL) 500 MG TABLET    Take 1,000 mg by mouth every 8 (eight) hours as needed for moderate pain.    BRIMONIDINE (ALPHAGAN) 0.15 % OPHTHALMIC SOLUTION    Place 1 drop into both eyes 2 (two) times daily.   BRINZOLAMIDE (AZOPT) 1 % OPHTHALMIC SUSPENSION    Place 1 drop into both eyes 3 (three) times daily.   CLOPIDOGREL (PLAVIX) 75 MG TABLET    Take 75 mg by mouth daily with breakfast.   FINASTERIDE (PROSCAR) 5 MG TABLET    Take 5 mg by mouth daily.   HYDRALAZINE (APRESOLINE) 10 MG TABLET    Take 5 mg by mouth 2 (two) times daily. TAKE 1/2 TABLET   LATANOPROST (XALATAN) 0.005 % OPHTHALMIC SOLUTION    Place 1 drop into both eyes at bedtime.   LISINOPRIL (PRINIVIL,ZESTRIL) 20 MG TABLET    Take 1 tablet (20 mg total) by mouth daily.   MEGARED OMEGA-3 KRILL OIL 500 MG CAPS    Take 1 tablet by mouth daily.   MULTIPLE VITAMINS-MINERALS (ICAPS AREDS 2 PO)  Take 1 tablet by mouth. Reported on 07/26/2015   TAMSULOSIN (FLOMAX) 0.4 MG CAPS CAPSULE    Take 0.4 mg by mouth daily after supper.   TRAMADOL (ULTRAM) 50 MG TABLET    Take 1 tablet (50 mg total) by mouth every 6 (six) hours as needed (PAIN).  Modified Medications   No medications on file  Discontinued Medications   IPRATROPIUM-ALBUTEROL (DUONEB) 0.5-2.5 (3) MG/3ML SOLN    Take 3 mLs by nebulization daily.     SIGNIFICANT DIAGNOSTIC EXAMS  05-20-15: ct of head and cervical spine: No evidence of intracranial or cervical spine injury.  05-20-15: chest x-ray: No active disease.  05-20-15: ct of right hip: Right greater trochanter fracture with approximately 2 cm of superior distraction. No intertrochanteric extension is identified but the patient is osteopenic and fractures can be occult on CT scan.   LABS REVIEWED:   05-20-15: wbc 14.6; hgb 11.9; hct 36.8; mcv 98.4; plt 260; glucose 138; bun 30; creat 1.20; k+ 4.5; na++137; liver normal albumin 3.5 05-22-15: wbc 16.1; hgb 12.0; hct 36.9; mcv 97.1; plt  287; glucose 111; bun 27; creat 1.21; k+ 4.2; na++138  06-29-15: pre-albumin 17 11-24-15: wbc 9.0; hgb 13.4; hct 40.1; mcv 96.2; plt 279; glucose 75; bun 19; creat 0.92; k+ 4.4; na++ 140; liver normal albumin 2.9     Review of Systems  Unable to perform ROS: other  hard of hearing   Physical Exam  Constitutional: No distress.  Eyes: Conjunctivae are normal.  Neck: Neck supple. No JVD present. No thyromegaly present.  Cardiovascular: Normal rate, regular rhythm and intact distal pulses.   Respiratory: Effort normal and breath sounds normal. No respiratory distress. [name] has no wheezes.  GI: Soft. Bowel sounds are normal. [name] exhibits no distension. There is no tenderness.  Musculoskeletal: [name] exhibits no edema.  Able to move all extremities  Is status post right hip fracture Jan 2017  is externally rotated    Lymphadenopathy:   [name] has no cervical adenopathy.  Neurological: [name] is alert. Is easily confused  Skin: Skin is warm and dry. [name] is not diaphoretic.  Has venous changes in lower extremities Psychiatric: [name] has a normal mood and affect.     ASSESSMENT/ PLAN:  1. Hypertension: will continue  hydralazine 5 mg twice daily will continue lisinopril 20 mg daily and will hold blood pressure medications for systolic 123456    2. BPH: will continue flomax daily and proscar daily   3. PAD: will continue plavix 75 mg daily will monitor  4. Glaucoma: Alphagan 1 drop both eyes twice daily;  azopt to both eyes three times daily; xalatan to both eyes nightly  5. Right hip fracture: (jan 2017)   Will continue  ultram  50 mg  every 6 hours as needed for pain management.   6. Malnutrition: albumin July 2017: 2.9   will continue supplements per facility protocol; current weight is 151  pounds     MD is aware of resident's narcotic use and is in agreement with current plan of care. We will attempt to wean resident as apropriate   Ok Edwards NP [facility]  Contact  [phone] Monday through Friday 8am- 5pm  After hours call [phone]

## 2016-02-01 ENCOUNTER — Encounter: Payer: Self-pay | Admitting: Internal Medicine

## 2016-02-01 ENCOUNTER — Non-Acute Institutional Stay (SKILLED_NURSING_FACILITY): Payer: Medicare Other | Admitting: Internal Medicine

## 2016-02-01 DIAGNOSIS — S72101D Unspecified trochanteric fracture of right femur, subsequent encounter for closed fracture with routine healing: Secondary | ICD-10-CM

## 2016-02-01 DIAGNOSIS — I739 Peripheral vascular disease, unspecified: Secondary | ICD-10-CM | POA: Diagnosis not present

## 2016-02-01 DIAGNOSIS — N400 Enlarged prostate without lower urinary tract symptoms: Secondary | ICD-10-CM | POA: Diagnosis not present

## 2016-02-01 DIAGNOSIS — R531 Weakness: Secondary | ICD-10-CM

## 2016-02-01 DIAGNOSIS — I10 Essential (primary) hypertension: Secondary | ICD-10-CM | POA: Diagnosis not present

## 2016-02-01 DIAGNOSIS — E440 Moderate protein-calorie malnutrition: Secondary | ICD-10-CM | POA: Diagnosis not present

## 2016-02-01 NOTE — Progress Notes (Signed)
Patient ID: [name], male   DOB: 07-Mar-1918, 82 y.o.   MRN: [id number]    DATE:  02/01/2016  Location:    [place] Room Number: 140 A Place of Service: SNF (31)   Extended Emergency Contact Information Primary Emergency Contact: [place] [facility] [place] Phone: [phone] Relation: Daughter  Advanced Directive information Does patient have an advance directive?: Yes, Type of Advance Directive: Out of facility DNR (pink MOST or yellow form), Does patient want to make changes to advanced directive?: No - Patient declined  Chief Complaint  Patient presents with  . Medical Management of Chronic Issues    Routine Visit    HPI:  82 yo male long term resident seen today for f/u. He is a poor historian due to very [place] and pt uncooperative and tells me "get [place] here". Hx obtained from chart. No nursing concerns. No falls. CBG 103 today  Hypertension - BP stable on  hydralazine 5 mg twice daily; lisinopril 20 mg daily and will hold blood pressure medications for systolic 123456    BPH - stable on flomax daily and proscar daily   PAD - stable. Takes plavix 75 mg daily will monitor  Glaucoma - stable on Alphagan 1 drop both eyes twice daily;  azopt to both eyes three times daily; xalatan to both eyes nightly  Hx Right hip fracture - dx in 05/2015.  Takes ultram 50 mg every 6 hours as needed for pain management.   Malnutrition - albumin in July 2017 was 2.9. Gets supplements per facility protocol. Weight 150 lbs     Past Medical History:  Diagnosis Date  . Arthritis   . Cancer ([place])    bladder  . Hypertension   . PAD (peripheral artery disease) ([place])   . PVD (peripheral vascular disease) ([place])   . S/P radiation therapy 02/15/2015 through 03/18/2015   Left pretibial region 4400 cGy in 10 sessions, two sessions a week for 5 weeks     Past Surgical History:  Procedure Laterality Date  . ACHILLES TENDON  SURGERY      Patient Care Team: [name], MD as PCP - General (Internal Medicine)  Social History   Social History  . Marital status: Married    Spouse name: N/A  . Number of children: N/A  . Years of education: N/A   Occupational History  . Not on file.   Social History Main Topics  . Smoking status: Current Some Day Smoker    Types: Pipe  . Smokeless tobacco: Never Used  . Alcohol use 1.2 oz/week    2 Glasses of wine per week  . Drug use: No  . Sexual activity: No   Other Topics Concern  . Not on file   Social History Narrative  . No narrative on file     reports that he has been smoking Pipe.  He has never used smokeless tobacco. He reports that he drinks about 1.2 oz of alcohol per week . He reports that he does not use drugs.  Family History  Problem Relation Age of Onset  . Arthritis Mother   . Hearing loss Father    Family Status  Relation Status  . Mother   . Father     Immunization History  Administered Date(s) Administered  . Influenza-Unspecified 02/13/2015  . Pneumococcal Polysaccharide-23 08/23/2013    No Known Allergies  Medications: Patient's Medications  New Prescriptions   No medications on file  Previous Medications  ACETAMINOPHEN (TYLENOL) 500 MG TABLET    Take 1,000 mg by mouth every 8 (eight) hours as needed for moderate pain.    BRIMONIDINE (ALPHAGAN) 0.15 % OPHTHALMIC SOLUTION    Place 1 drop into both eyes 2 (two) times daily.   BRINZOLAMIDE (AZOPT) 1 % OPHTHALMIC SUSPENSION    Place 1 drop into both eyes 3 (three) times daily.   CLOPIDOGREL (PLAVIX) 75 MG TABLET    Take 75 mg by mouth daily with breakfast.   FINASTERIDE (PROSCAR) 5 MG TABLET    Take 5 mg by mouth daily.   HYDRALAZINE (APRESOLINE) 10 MG TABLET    Take 5 mg by mouth 2 (two) times daily. TAKE 1/2 TABLET   LATANOPROST (XALATAN) 0.005 % OPHTHALMIC SOLUTION    Place 1 drop into both eyes at bedtime.   LISINOPRIL (PRINIVIL,ZESTRIL) 20 MG TABLET    Take 1 tablet  (20 mg total) by mouth daily.   MEGARED OMEGA-3 KRILL OIL 500 MG CAPS    Take 1 tablet by mouth daily.   MULTIPLE VITAMINS-MINERALS (ICAPS AREDS 2 PO)    Take 1 tablet by mouth. Reported on 07/26/2015   TAMSULOSIN (FLOMAX) 0.4 MG CAPS CAPSULE    Take 0.4 mg by mouth daily after supper.   TRAMADOL (ULTRAM) 50 MG TABLET    Take 1 tablet (50 mg total) by mouth every 6 (six) hours as needed (PAIN).  Modified Medications   No medications on file  Discontinued Medications   No medications on file    Review of Systems  Unable to perform ROS: Other  very HOH  Vitals:   02/01/16 1050  BP: 140/80  Pulse: 70  Resp: 18  Temp: 98.1 F (36.7 C)  TempSrc: Oral  SpO2: 98%  Weight: 150 lb 6.4 oz (68.2 kg)  Height: 6' (1.829 m)   Body mass index is 20.4 kg/m.  Physical Exam  Constitutional: He appears well-developed.  Sitting in w/c in NAD. Frail appearing. HOH  HENT:  Mouth/Throat: Oropharynx is clear and moist.  MMM  Eyes: Pupils are equal, round, and reactive to light. No scleral icterus.  Neck: Neck supple. Carotid bruit is not present.  Refused exam  Cardiovascular: Normal rate, regular rhythm and intact distal pulses.  Exam reveals no gallop and no friction rub.   No murmur heard. +1 pitting LE edema R>L with chronic venous stasis changes. No calf TTP  Pulmonary/Chest: Effort normal and breath sounds normal. He has no wheezes. He has no rales. He exhibits no tenderness.  Abdominal: He exhibits no abdominal bruit and no pulsatile midline mass.  Refused exam  Genitourinary:  Genitourinary Comments: Foley cath DTG dark yellow urine  Musculoskeletal: He exhibits edema.  Lymphadenopathy:    He has no cervical adenopathy.  Neurological: He is alert.  Skin: Skin is warm and dry. Lesion noted. No rash noted.     Psychiatric: He has a normal mood and affect. His behavior is normal.     Labs reviewed: Nursing Home on 12/27/2015  Component Date Value Ref Range Status  .  Hemoglobin 11/24/2015 13.4* 13.5 - 17.5 g/dL Final  . HCT 11/24/2015 40* 41 - 53 % Final  . Platelets 11/24/2015 279  150 - 399 K/L Final  . WBC 11/24/2015 9.0  10^3/mL Final  . Glucose 11/24/2015 75  mg/dL Final  . BUN 11/24/2015 19  4 - 21 mg/dL Final  . Creatinine 11/24/2015 0.9  0.6 - 1.3 mg/dL Final  . Potassium 11/24/2015 4.4  3.4 -  5.3 mmol/L Final  . Sodium 11/24/2015 140  137 - 147 mmol/L Final  . Alkaline Phosphatase 11/24/2015 66  25 - 125 U/L Final  . ALT 11/24/2015 14  10 - 40 U/L Final  . AST 11/24/2015 18  14 - 40 U/L Final  . Bilirubin, Total 11/24/2015 0.5  mg/dL Final    No results found.   Assessment/Plan   ICD-9-CM ICD-10-CM   1. Malnutrition of moderate degree 263.0 E44.0   2. Essential hypertension 401.9 I10   3. PAD (peripheral artery disease) (HCC) 443.9 I73.9   4. BPH (benign prostatic hyperplasia) 600.00 N40.0   5. Weakness 780.79 R53.1   6. Closed fracture of trochanter of right femur with routine healing, subsequent encounter V54.13 S72.101D     Cont current meds as ordered  PT/OT/St as indicated  Cont nutritional supplements as ordered  Fall precautions  Wound care as indicated  Will follow  Kamaiya Antilla S. Perlie Gold  [facility] [address] [place], [place] [address] [phone] Cell (Monday-Friday 8 AM - 5 PM) [phone] After 5 PM and follow prompts

## 2016-03-02 ENCOUNTER — Encounter: Payer: Self-pay | Admitting: Adult Health

## 2016-03-02 ENCOUNTER — Non-Acute Institutional Stay (SKILLED_NURSING_FACILITY): Payer: Medicare Other | Admitting: Adult Health

## 2016-03-02 DIAGNOSIS — H409 Unspecified glaucoma: Secondary | ICD-10-CM | POA: Diagnosis not present

## 2016-03-02 DIAGNOSIS — I10 Essential (primary) hypertension: Secondary | ICD-10-CM

## 2016-03-02 DIAGNOSIS — S72101D Unspecified trochanteric fracture of right femur, subsequent encounter for closed fracture with routine healing: Secondary | ICD-10-CM

## 2016-03-02 DIAGNOSIS — N400 Enlarged prostate without lower urinary tract symptoms: Secondary | ICD-10-CM | POA: Diagnosis not present

## 2016-03-02 DIAGNOSIS — I739 Peripheral vascular disease, unspecified: Secondary | ICD-10-CM

## 2016-03-02 DIAGNOSIS — E440 Moderate protein-calorie malnutrition: Secondary | ICD-10-CM | POA: Diagnosis not present

## 2016-03-02 NOTE — Progress Notes (Signed)
Patient ID: [name], male   DOB: 07/30/1917, 82 y.o.   MRN: [id number]   Location:   [place] Room Number: 140-A Place of Service:  SNF (31)   CODE STATUS: DNR  No Known Allergies  Chief Complaint  Patient presents with  . Medical Management of Chronic Issues    Follow up    HPI:  He is a long term resident of this facility being seen for the management of his chronic illnesses. Overall there is little change in his status. He does get out of bed on a daily basis. He is unable to fully participate in the hpi or ros; but did tell me that he is feeling good. There are no nursing concerns at this time.    Past Medical History:  Diagnosis Date  . Arthritis   . Cancer ([place])    bladder  . Hypertension   . PAD (peripheral artery disease) ([place])   . PVD (peripheral vascular disease) ([place])   . S/P radiation therapy 02/15/2015 through 03/18/2015   Left pretibial region 4400 cGy in 10 sessions, two sessions a week for 5 weeks     Past Surgical History:  Procedure Laterality Date  . ACHILLES TENDON SURGERY      Social History   Social History  . Marital status: Married    Spouse name: N/A  . Number of children: N/A  . Years of education: N/A   Occupational History  . Not on file.   Social History Main Topics  . Smoking status: Current Some Day Smoker    Types: Pipe  . Smokeless tobacco: Never Used  . Alcohol use 1.2 oz/week    2 Glasses of wine per week  . Drug use: No  . Sexual activity: No   Other Topics Concern  . Not on file   Social History Narrative  . No narrative on file   Family History  Problem Relation Age of Onset  . Arthritis Mother   . Hearing loss Father       VITAL SIGNS BP (!) 152/70   Pulse (!) 55   Temp (!) 96.3 F (35.7 C) (Oral)   Resp 18   Ht 6' (1.829 m)   Wt 150 lb 1 oz (68.1 kg)   SpO2 96%   BMI 20.35 kg/m   Patient's Medications  New Prescriptions   No medications on  file  Previous Medications   ACETAMINOPHEN (TYLENOL) 500 MG TABLET    Take 1,000 mg by mouth every 8 (eight) hours as needed for moderate pain.    BRIMONIDINE (ALPHAGAN) 0.15 % OPHTHALMIC SOLUTION    Place 1 drop into both eyes 2 (two) times daily.   BRINZOLAMIDE (AZOPT) 1 % OPHTHALMIC SUSPENSION    Place 1 drop into both eyes 3 (three) times daily.   CLOPIDOGREL (PLAVIX) 75 MG TABLET    Take 75 mg by mouth daily with breakfast.   FINASTERIDE (PROSCAR) 5 MG TABLET    Take 5 mg by mouth daily.   HYDRALAZINE (APRESOLINE) 10 MG TABLET    Take 5 mg by mouth 2 (two) times daily. TAKE 1/2 TABLET   KETOCONAZOLE, TOPICAL, 1 % SHAM    Apply topically. Apply every shift every Mon, Thu   LATANOPROST (XALATAN) 0.005 % OPHTHALMIC SOLUTION    Place 1 drop into both eyes at bedtime.   LISINOPRIL (PRINIVIL,ZESTRIL) 20 MG TABLET    Take 1 tablet (20 mg total) by mouth daily.   MEGARED OMEGA-3  KRILL OIL 500 MG CAPS    Take 1 tablet by mouth daily.   MULTIPLE VITAMINS-MINERALS (ICAPS AREDS 2 PO)    Take 1 tablet by mouth. Reported on 07/26/2015   TAMSULOSIN (FLOMAX) 0.4 MG CAPS CAPSULE    Take 0.4 mg by mouth daily after supper.   TRAMADOL (ULTRAM) 50 MG TABLET    Take 1 tablet (50 mg total) by mouth every 6 (six) hours as needed (PAIN).  Modified Medications   No medications on file  Discontinued Medications   No medications on file     SIGNIFICANT DIAGNOSTIC EXAMS  05-20-15: ct of head and cervical spine: No evidence of intracranial or cervical spine injury.  05-20-15: chest x-ray: No active disease.  05-20-15: ct of right hip: Right greater trochanter fracture with approximately 2 cm of superior distraction. No intertrochanteric extension is identified but the patient is osteopenic and fractures can be occult on CT scan.   LABS REVIEWED:   05-20-15: wbc 14.6; hgb 11.9; hct 36.8; mcv 98.4; plt 260; glucose 138; bun 30; creat 1.20; k+ 4.5; na++137; liver normal albumin 3.5 05-22-15: wbc 16.1; hgb 12.0; hct  36.9; mcv 97.1; plt 287; glucose 111; bun 27; creat 1.21; k+ 4.2; na++138  06-29-15: pre-albumin 17 11-24-15: wbc 9.0; hgb 13.4; hct 40.1; mcv 96.2; plt 279; glucose 75; bun 19; creat 0.92; k+ 4.4; na++ 140; liver normal albumin 2.9     Review of Systems  Unable to perform ROS: other  hard of hearing   Physical Exam  Constitutional: No distress.  Eyes: Conjunctivae are normal.  Neck: Neck supple. No JVD present. No thyromegaly present.  Cardiovascular: Normal rate, regular rhythm and intact distal pulses.   Respiratory: Effort normal and breath sounds normal. No respiratory distress. He has no wheezes.  GI: Soft. Bowel sounds are normal. He exhibits no distension. There is no tenderness.  Musculoskeletal: He exhibits no edema.  Able to move all extremities  Is status post right hip fracture Jan 2017     Lymphadenopathy:   He has no cervical adenopathy.  Neurological: He is alert. Is easily confused  Skin: Skin is warm and dry. He is not diaphoretic.  Has venous changes in lower extremities Has scabbed area on left cheek Psychiatric: He has a normal mood and affect.     ASSESSMENT/ PLAN:  1. Hypertension: will continue  hydralazine 5 mg twice daily will continue lisinopril 20 mg daily and will hold blood pressure medications for systolic 123456    2. BPH: will continue flomax daily and proscar daily   3. PAD: will continue plavix 75 mg daily will monitor  4. Glaucoma: Alphagan 1 drop both eyes twice daily;  azopt to both eyes three times daily; xalatan to both eyes nightly  5. Right hip fracture: (jan 2017)   Will continue  ultram  50 mg  every 6 hours as needed for pain management.   6. Malnutrition: albumin July 2017: 2.9   will continue supplements per facility protocol; current weight is 150  pounds      MD is aware of resident's narcotic use and is in agreement with current plan of care. We will attempt to wean resident as apropriate   Ok Edwards NP [facility]  Adult Medicine  Contact [phone] Monday through Friday 8am- 5pm  After hours call [phone]

## 2016-04-03 ENCOUNTER — Non-Acute Institutional Stay (SKILLED_NURSING_FACILITY): Payer: Medicare Other | Admitting: Adult Health

## 2016-04-03 ENCOUNTER — Encounter: Payer: Self-pay | Admitting: Adult Health

## 2016-04-03 DIAGNOSIS — I739 Peripheral vascular disease, unspecified: Secondary | ICD-10-CM

## 2016-04-03 DIAGNOSIS — I10 Essential (primary) hypertension: Secondary | ICD-10-CM | POA: Diagnosis not present

## 2016-04-03 DIAGNOSIS — H409 Unspecified glaucoma: Secondary | ICD-10-CM | POA: Diagnosis not present

## 2016-04-03 DIAGNOSIS — N400 Enlarged prostate without lower urinary tract symptoms: Secondary | ICD-10-CM | POA: Diagnosis not present

## 2016-04-03 DIAGNOSIS — E440 Moderate protein-calorie malnutrition: Secondary | ICD-10-CM

## 2016-04-03 DIAGNOSIS — S72101D Unspecified trochanteric fracture of right femur, subsequent encounter for closed fracture with routine healing: Secondary | ICD-10-CM | POA: Diagnosis not present

## 2016-04-03 NOTE — Progress Notes (Signed)
Patient ID: [name], male   DOB: [birth year], 82 y.o.   MRN: [id number]    Location:   [place] Room Number: 140-A Place of Service:  SNF (31)   CODE STATUS: DNR  No Known Allergies  Chief Complaint  Patient presents with  . Medical Management of Chronic Issues    Follow up    HPI:  He is a long term resident of this facility being seen for the management of his chronic illnesses. Overall there is little change in his status. He cannot fully participate in the hpi or ros. There are no nursing concerns at this time. He does get out of bed on a daily basis.    Past Medical History:  Diagnosis Date  . Arthritis   . Cancer ([place])    bladder  . Hypertension   . PAD (peripheral artery disease) ([place])   . PVD (peripheral vascular disease) ([place])   . S/P radiation therapy 02/15/2015 through 03/18/2015   Left pretibial region 4400 cGy in 10 sessions, two sessions a week for 5 weeks     Past Surgical History:  Procedure Laterality Date  . ACHILLES TENDON SURGERY      Social History   Social History  . Marital status: Married    Spouse name: N/A  . Number of children: N/A  . Years of education: N/A   Occupational History  . Not on file.   Social History Main Topics  . Smoking status: Current Some Day Smoker    Types: Pipe  . Smokeless tobacco: Never Used  . Alcohol use 1.2 oz/week    2 Glasses of wine per week  . Drug use: No  . Sexual activity: No   Other Topics Concern  . Not on file   Social History Narrative  . No narrative on file   Family History  Problem Relation Age of Onset  . Arthritis Mother   . Hearing loss Father       VITAL SIGNS BP 132/76   Pulse (!) 54   Temp 97.5 F (36.4 C) (Oral)   Resp 18   Ht 6' (1.829 m)   Wt 148 lb (67.1 kg)   SpO2 96%   BMI 20.07 kg/m   Patient's Medications  New Prescriptions   No medications on file  Previous Medications   ACETAMINOPHEN (TYLENOL) 500  MG TABLET    Take 1,000 mg by mouth every 8 (eight) hours as needed for moderate pain.    BRIMONIDINE (ALPHAGAN) 0.15 % OPHTHALMIC SOLUTION    Place 1 drop into both eyes 2 (two) times daily.   BRINZOLAMIDE (AZOPT) 1 % OPHTHALMIC SUSPENSION    Place 1 drop into both eyes 3 (three) times daily.   CLOPIDOGREL (PLAVIX) 75 MG TABLET    Take 75 mg by mouth daily with breakfast.   FINASTERIDE (PROSCAR) 5 MG TABLET    Take 5 mg by mouth daily.   HYDRALAZINE (APRESOLINE) 10 MG TABLET    Take 5 mg by mouth 2 (two) times daily. TAKE 1/2 TABLET   KETOCONAZOLE, TOPICAL, 1 % SHAM    Apply topically. Apply every shift every Mon, Thu   LATANOPROST (XALATAN) 0.005 % OPHTHALMIC SOLUTION    Place 1 drop into both eyes at bedtime.   LISINOPRIL (PRINIVIL,ZESTRIL) 20 MG TABLET    Take 1 tablet (20 mg total) by mouth daily.   MEGARED OMEGA-3 KRILL OIL 500 MG CAPS    Take 1 tablet by mouth daily.  MULTIPLE VITAMINS-MINERALS (ICAPS AREDS 2 PO)    Take 1 tablet by mouth. Reported on 07/26/2015   TAMSULOSIN (FLOMAX) 0.4 MG CAPS CAPSULE    Take 0.4 mg by mouth daily after supper.   TRAMADOL (ULTRAM) 50 MG TABLET    Take 1 tablet (50 mg total) by mouth every 6 (six) hours as needed (PAIN).  Modified Medications   No medications on file  Discontinued Medications   No medications on file     SIGNIFICANT DIAGNOSTIC EXAMS  05-20-15: ct of head and cervical spine: No evidence of intracranial or cervical spine injury.  05-20-15: chest x-ray: No active disease.  05-20-15: ct of right hip: Right greater trochanter fracture with approximately 2 cm of superior distraction. No intertrochanteric extension is identified but the patient is osteopenic and fractures can be occult on CT scan.   LABS REVIEWED:   05-20-15: wbc 14.6; hgb 11.9; hct 36.8; mcv 98.4; plt 260; glucose 138; bun 30; creat 1.20; k+ 4.5; na++137; liver normal albumin 3.5 05-22-15: wbc 16.1; hgb 12.0; hct 36.9; mcv 97.1; plt 287; glucose 111; bun 27; creat 1.21; k+  4.2; na++138  06-29-15: pre-albumin 17 11-24-15: wbc 9.0; hgb 13.4; hct 40.1; mcv 96.2; plt 279; glucose 75; bun 19; creat 0.92; k+ 4.4; na++ 140; liver normal albumin 2.9     Review of Systems  Unable to perform ROS: other  hard of hearing   Physical Exam  Constitutional: No distress.  Eyes: Conjunctivae are normal.  Neck: Neck supple. No JVD present. No thyromegaly present.  Cardiovascular: Normal rate, regular rhythm and intact distal pulses.   Respiratory: Effort normal and breath sounds normal. No respiratory distress. He has no wheezes.  GI: Soft. Bowel sounds are normal. He exhibits no distension. There is no tenderness.  Musculoskeletal: He exhibits no edema.  Able to move all extremities  Is status post right hip fracture Jan 2017     Lymphadenopathy:   He has no cervical adenopathy.  Neurological: He is alert. Is easily confused  Skin: Skin is warm and dry. He is not diaphoretic.  Has venous changes in lower extremities Has scabbed area on left cheek Psychiatric: He has a normal mood and affect.     ASSESSMENT/ PLAN:  1. Hypertension: will continue  hydralazine 5 mg twice daily will continue lisinopril 20 mg daily and will hold blood pressure medications for systolic 123456    2. BPH: will continue flomax daily and proscar daily   3. PAD: will continue plavix 75 mg daily will monitor  4. Glaucoma: Alphagan 1 drop both eyes twice daily;  azopt to both eyes three times daily; xalatan to both eyes nightly  5. Right hip fracture: (jan 2017)   Will continue  ultram  50 mg  every 6 hours as needed for pain management.   6. Malnutrition: albumin July 2017: 2.9   will continue supplements per facility protocol; current weight is 148  pounds      MD is aware of resident's narcotic use and is in agreement with current plan of care. We will attempt to wean resident as apropriate   Ok Edwards NP [facility] Adult Medicine  Contact [phone] Monday through Friday 8am-  5pm  After hours call [phone]

## 2016-04-22 IMAGING — CT CT CERVICAL SPINE W/O CM
5 of 6 series · 15 of 33 positions shown, 17 images · non-contrast
Comparison: None.

CLINICAL DATA: Fall on Plavix.  Found on floor.  Initial encounter.

EXAM:
CT HEAD WITHOUT CONTRAST
CT CERVICAL SPINE WITHOUT CONTRAST
TECHNIQUE: Multidetector CT imaging of the head and cervical spine was
performed following the standard protocol without intravenous
contrast. Multiplanar CT image reconstructions of the cervical spine
were also generated.

[Series 3: head bone · axial · 0.46mm/px · z∈[-85,-31]mm · 2 of 82 slices shown]
[im 28/82  bone]
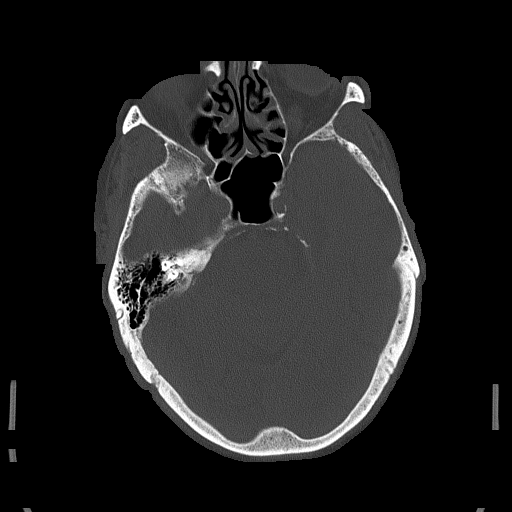
[im 55/82  bone]
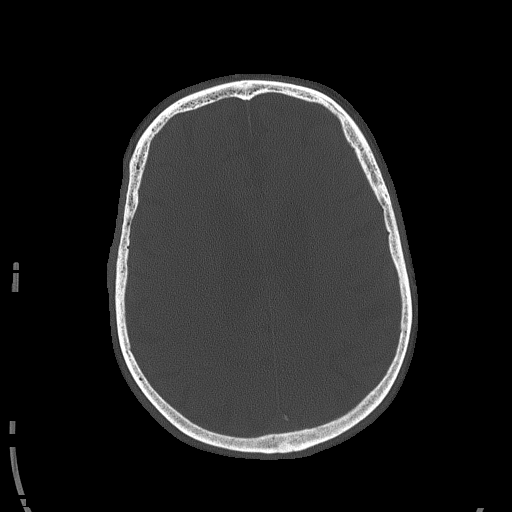

[Series 5: c_spine 2.0 st · axial · 0.36mm/px · z∈[-250,-184]mm · 2 of 101 slices shown]
[im 34/101  bone]
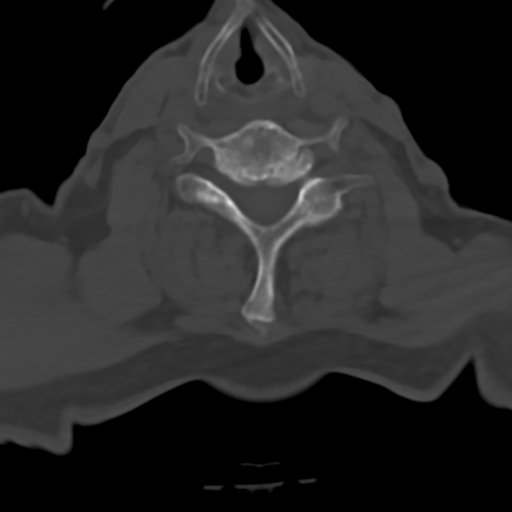
[im 67/101  bone]
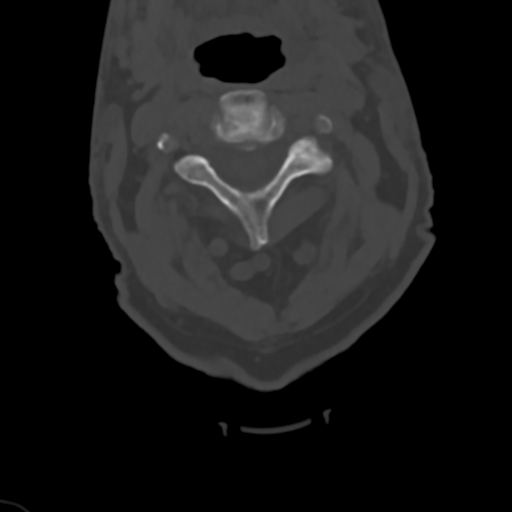

[Series 7: c_spine 2.0 sag bone · sagittal · 0.36mm/px · 5 of 61 slices shown, 6 images]
[im 21/61  bone]
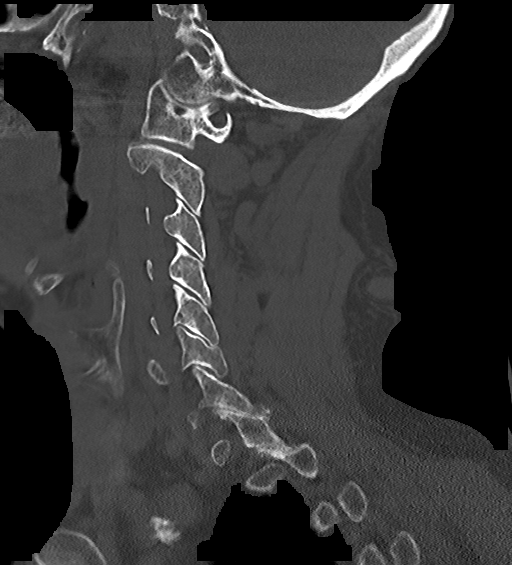
[im 26/61  bone]
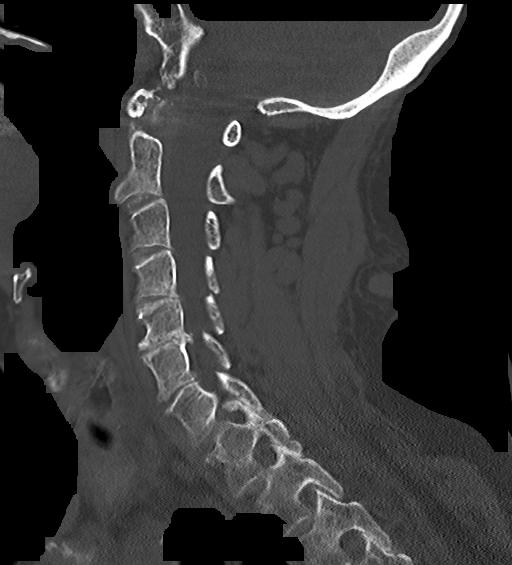
[im 31/61  soft-tissue]
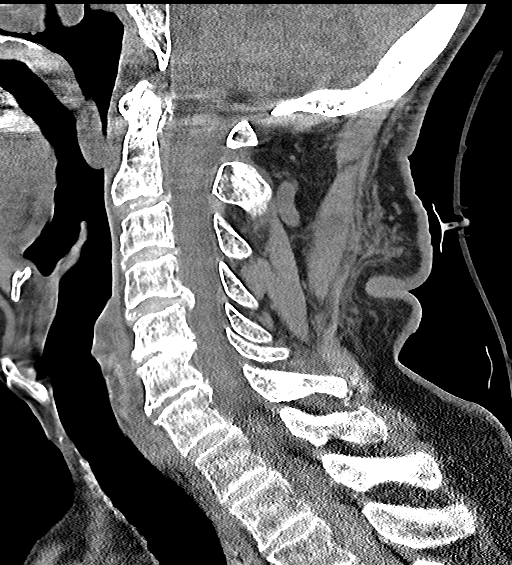
[im 31/61  bone]
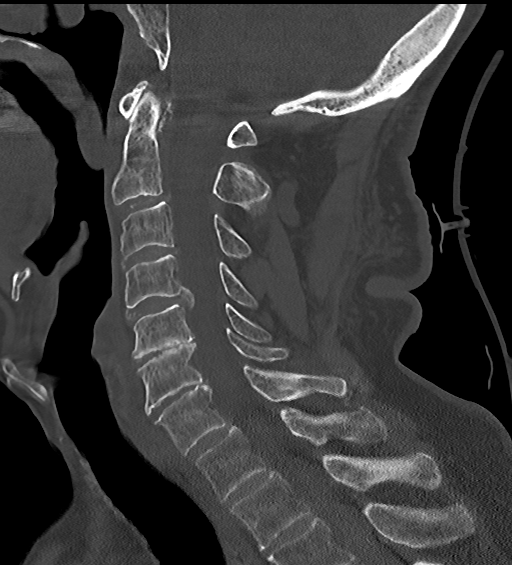
[im 36/61  bone]
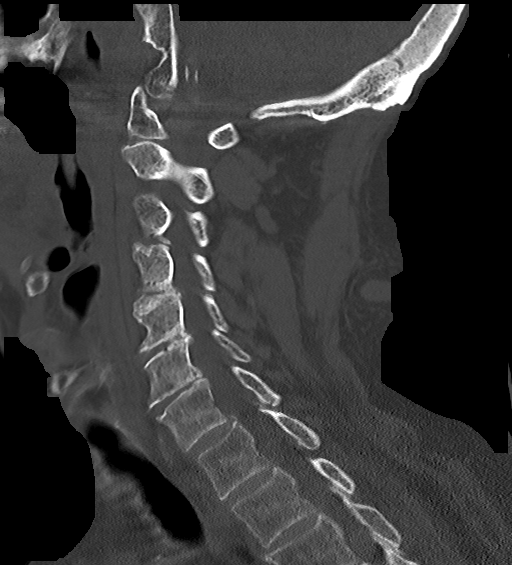
[im 41/61  bone]
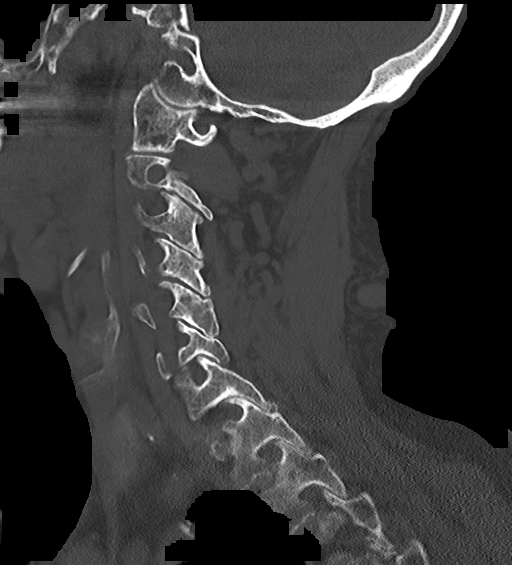

[Series 8: c_spine 2.0 cor bone · coronal · 0.29mm/px · 3 of 60 slices shown]
[im 12/60  bone]
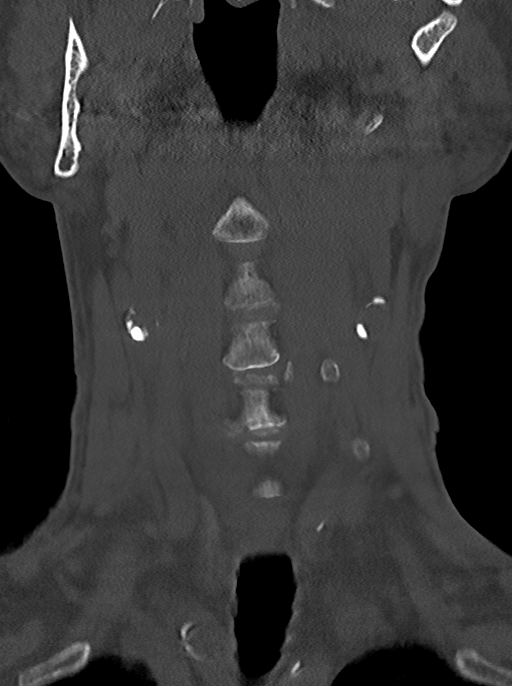
[im 24/60  bone]
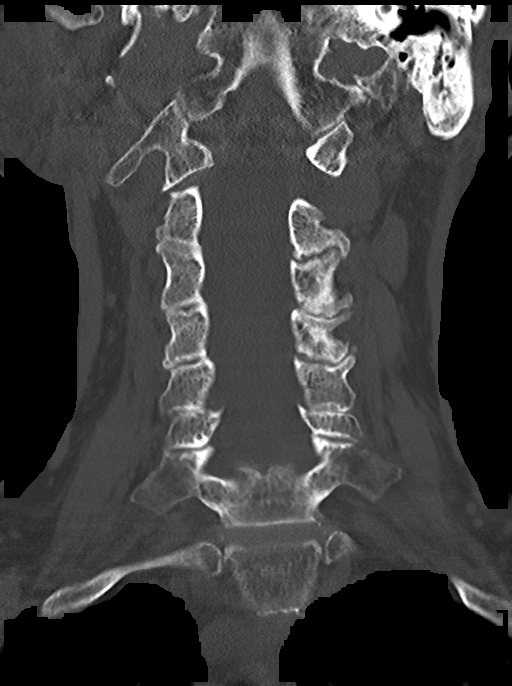
[im 36/60  bone]
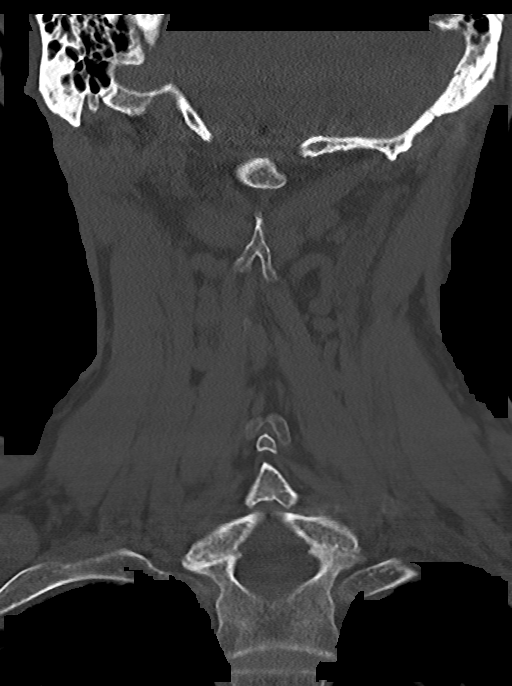

[Series 10: c_spine 2.0 orthogonals · axial · 0.21mm/px · z∈[-295,-194]mm · 3 of 104 slices shown, 4 images]
[im 26/104  soft-tissue]
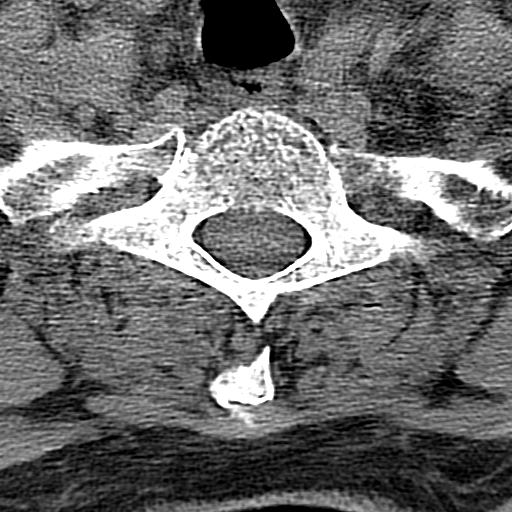
[im 26/104  bone]
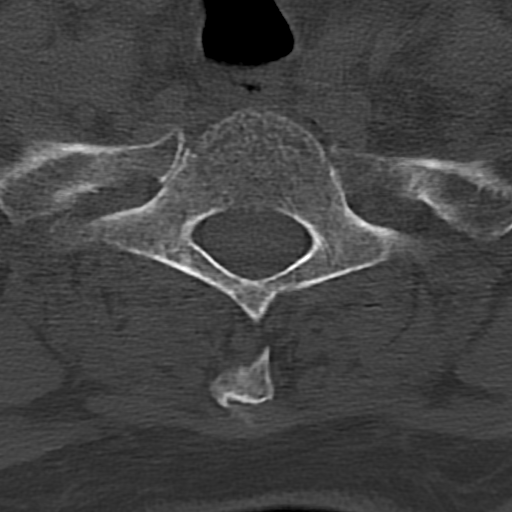
[im 52/104  bone]
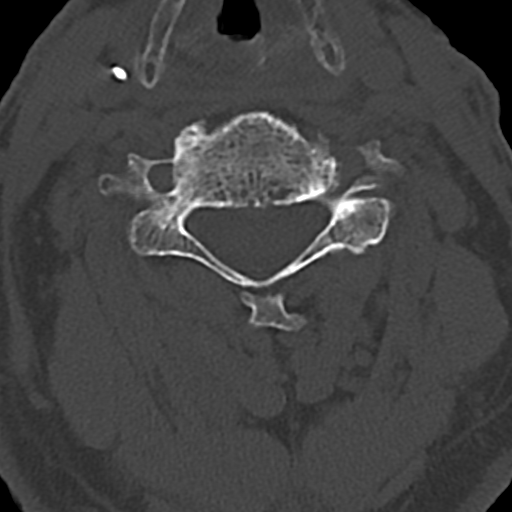
[im 78/104  bone]
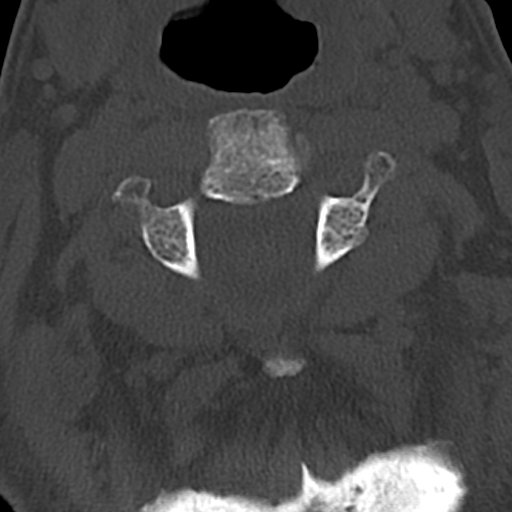

[15 of 33 positions shown; findings below may reference images not displayed]

FINDINGS: CT HEAD FINDINGS

Skull and Sinuses:Negative for fracture. There is subtle deep soft
tissue thickening at the vertex, not convincing for acute contusion.

Bilateral mild chronic mastoiditis with sclerosis and opacification
in the mastoid tips.

Visualized orbits: No traumatic finding.  Right cataract resection

Brain: No evidence of acute infarction, hemorrhage, hydrocephalus,
or mass lesion/mass effect. Age congruent generalized volume loss
and small vessel ischemic change in the deep cerebral white matter.

CT CERVICAL SPINE FINDINGS

Negative for acute fracture or subluxation. No prevertebral edema.
No gross cervical canal hematoma.

Multilevel disc degeneration with occasional calcification. There is
notable bulging disc at C4-5 with ventral cord contact but no
suspected mass-effect.

Biapical pleural based scarring.
IMPRESSION: No evidence of intracranial or cervical spine injury.

## 2016-05-16 ENCOUNTER — Non-Acute Institutional Stay (SKILLED_NURSING_FACILITY): Payer: Medicare Other | Admitting: Adult Health

## 2016-05-16 ENCOUNTER — Encounter: Payer: Self-pay | Admitting: Adult Health

## 2016-05-16 DIAGNOSIS — I739 Peripheral vascular disease, unspecified: Secondary | ICD-10-CM | POA: Diagnosis not present

## 2016-05-16 DIAGNOSIS — S72001S Fracture of unspecified part of neck of right femur, sequela: Secondary | ICD-10-CM | POA: Diagnosis not present

## 2016-05-16 DIAGNOSIS — H409 Unspecified glaucoma: Secondary | ICD-10-CM

## 2016-05-16 DIAGNOSIS — E440 Moderate protein-calorie malnutrition: Secondary | ICD-10-CM

## 2016-05-16 DIAGNOSIS — N400 Enlarged prostate without lower urinary tract symptoms: Secondary | ICD-10-CM

## 2016-05-16 DIAGNOSIS — I10 Essential (primary) hypertension: Secondary | ICD-10-CM | POA: Diagnosis not present

## 2016-05-16 NOTE — Progress Notes (Signed)
Location:    Adult nurse of Service:  SNF (31)   CODE STATUS: dnr  No Known Allergies   Chief Complaint  Patient presents with  . Annual Exam     HPI:  He is a long term resident of this facility being seen for his annual exam. . Overall there is little change in his status. He has not required any hospitalizations over the past year.  He is unable to fully participate in the hpi or ros; but did tell me that he is feeling good. There are no nursing concerns at this time.    Past Medical History:  Diagnosis Date  . Arthritis   . Cancer ([place])    bladder  . Hypertension   . PAD (peripheral artery disease) ([place])   . PVD (peripheral vascular disease) ([place])   . S/P radiation therapy 02/15/2015 through 03/18/2015   Left pretibial region 4400 cGy in 10 sessions, two sessions a week for 5 weeks     Past Surgical History:  Procedure Laterality Date  . ACHILLES TENDON SURGERY      Social History   Social History  . Marital status: Married    Spouse name: N/A  . Number of children: N/A  . Years of education: N/A   Occupational History  . Not on file.   Social History Main Topics  . Smoking status: Current Some Day Smoker    Types: Pipe  . Smokeless tobacco: Never Used  . Alcohol use 1.2 oz/week    2 Glasses of wine per week  . Drug use: No  . Sexual activity: No   Other Topics Concern  . Not on file   Social History Narrative  . No narrative on file   Family History  Problem Relation Age of Onset  . Arthritis Mother   . Hearing loss Father       VITAL SIGNS BP 126/75   Pulse 60   Temp 99.5 F (37.5 C)   Resp 16   Ht 6\' 1"  (1.854 m)   Wt 137 lb 3.2 oz (62.2 kg)   SpO2 97%   BMI 18.10 kg/m   Patient's Medications  New Prescriptions   No medications on file  Previous Medications   ACETAMINOPHEN (TYLENOL) 500 MG TABLET    Take 1,000 mg by mouth every 8 (eight) hours as needed for moderate pain.    BRIMONIDINE  (ALPHAGAN) 0.15 % OPHTHALMIC SOLUTION    Place 1 drop into both eyes 2 (two) times daily.   BRINZOLAMIDE (AZOPT) 1 % OPHTHALMIC SUSPENSION    Place 1 drop into both eyes 3 (three) times daily.   CLOPIDOGREL (PLAVIX) 75 MG TABLET    Take 75 mg by mouth daily with breakfast.   FINASTERIDE (PROSCAR) 5 MG TABLET    Take 5 mg by mouth daily.   HYDRALAZINE (APRESOLINE) 10 MG TABLET    Take 5 mg by mouth 2 (two) times daily. TAKE 1/2 TABLET   KETOCONAZOLE, TOPICAL, 1 % SHAM    Apply topically. Apply every shift every Mon, Thu   LATANOPROST (XALATAN) 0.005 % OPHTHALMIC SOLUTION    Place 1 drop into both eyes at bedtime.   LISINOPRIL (PRINIVIL,ZESTRIL) 20 MG TABLET    Take 1 tablet (20 mg total) by mouth daily.   MEGARED OMEGA-3 KRILL OIL 500 MG CAPS    Take 1 tablet by mouth daily.   MULTIPLE VITAMINS-MINERALS (ICAPS AREDS 2 PO)    Take 1 tablet by mouth.  Reported on 07/26/2015   TAMSULOSIN (FLOMAX) 0.4 MG CAPS CAPSULE    Take 0.4 mg by mouth daily after supper.   TRAMADOL (ULTRAM) 50 MG TABLET    Take 50 mg by mouth 4 (four) times daily.  Modified Medications   No medications on file  Discontinued Medications     SIGNIFICANT DIAGNOSTIC EXAMS   05-20-15: ct of head and cervical spine: No evidence of intracranial or cervical spine injury.  05-20-15: chest x-ray: No active disease.  05-20-15: ct of right hip: Right greater trochanter fracture with approximately 2 cm of superior distraction. No intertrochanteric extension is identified but the patient is osteopenic and fractures can be occult on CT scan.  01-04-16: right shoulder x-ray; A C joint arthritis   LABS REVIEWED:   05-20-15: wbc 14.6; hgb 11.9; hct 36.8; mcv 98.4; plt 260; glucose 138; bun 30; creat 1.20; k+ 4.5; na++137; liver normal albumin 3.5 05-22-15: wbc 16.1; hgb 12.0; hct 36.9; mcv 97.1; plt 287; glucose 111; bun 27; creat 1.21; k+ 4.2; na++138  06-29-15: pre-albumin 17 11-24-15: wbc 9.0; hgb 13.4; hct 40.1; mcv 96.2; plt 279; glucose 75;  bun 19; creat 0.92; k+ 4.4; na++ 140; liver normal albumin 2.9     Review of Systems  Unable to perform ROS: other  hard of hearing   Physical Exam  Constitutional: No distress.  Eyes: Conjunctivae are normal.  Neck: Neck supple. No JVD present. No thyromegaly present.  Cardiovascular: Normal rate, regular rhythm and intact distal pulses.   Respiratory: Effort normal and breath sounds normal. No respiratory distress. He has no wheezes.  GI: Soft. Bowel sounds are normal. He exhibits no distension. There is no tenderness.  Musculoskeletal: He exhibits no edema.  Able to move all extremities  Is status post right hip fracture Jan 2017     Lymphadenopathy:   He has no cervical adenopathy.  Neurological: He is alert. Is easily confused  Skin: Skin is warm and dry. He is not diaphoretic.  Has venous changes in lower extremities Has scabbed area on left cheek Psychiatric: He has a normal mood and affect.     ASSESSMENT/ PLAN:  1. Hypertension: will continue  hydralazine 5 mg twice daily will continue lisinopril 20 mg daily and will hold blood pressure medications for systolic 123456    2. BPH: will continue flomax daily and proscar daily   3. PAD: will continue plavix 75 mg daily will monitor  4. Glaucoma: Alphagan 1 drop both eyes twice daily;  azopt to both eyes three times daily; xalatan to both eyes nightly  5. Right hip fracture: (jan 2017)   Will continue  ultram  50 mg  every 6 hours for chronic  pain management.   6. Malnutrition: albumin July 2017: 2.9   will continue supplements per facility protocol; current weight is 137  pounds   Will check cbc; cmp   His health maintenance is up to date.   Time spent with patient  45  minutes >50% time spent counseling; reviewing medical record; tests; labs; and developing future plan of care   MD is aware of resident's narcotic use and is in agreement with current plan of care. We will attempt to wean resident as apropriate    Ok Edwards NP [facility] Adult Medicine  Contact [phone] Monday through Friday 8am- 5pm  After hours call [phone]

## 2016-05-17 LAB — CBC AND DIFFERENTIAL
HCT: 39 % — AB (ref 41–53)
Hemoglobin: 13 g/dL — AB (ref 13.5–17.5)
NEUTROS ABS: 7 /uL
Platelets: 258 10*3/uL (ref 150–399)
WBC: 10.1 10*3/mL

## 2016-05-17 LAB — BASIC METABOLIC PANEL
BUN: 25 mg/dL — AB (ref 4–21)
Creatinine: 0.7 mg/dL (ref 0.6–1.3)
GLUCOSE: 135 mg/dL
Potassium: 5 mmol/L (ref 3.4–5.3)
SODIUM: 142 mmol/L (ref 137–147)

## 2016-05-17 LAB — HEPATIC FUNCTION PANEL
ALT: 22 U/L (ref 10–40)
AST: 20 U/L (ref 14–40)
Alkaline Phosphatase: 73 U/L (ref 25–125)
Bilirubin, Total: 0.5 mg/dL

## 2016-06-19 ENCOUNTER — Non-Acute Institutional Stay (SKILLED_NURSING_FACILITY): Payer: Medicare Other | Admitting: Adult Health

## 2016-06-19 DIAGNOSIS — I10 Essential (primary) hypertension: Secondary | ICD-10-CM

## 2016-06-19 DIAGNOSIS — N400 Enlarged prostate without lower urinary tract symptoms: Secondary | ICD-10-CM

## 2016-06-19 DIAGNOSIS — E440 Moderate protein-calorie malnutrition: Secondary | ICD-10-CM | POA: Diagnosis not present

## 2016-06-19 DIAGNOSIS — S72101D Unspecified trochanteric fracture of right femur, subsequent encounter for closed fracture with routine healing: Secondary | ICD-10-CM

## 2016-06-19 DIAGNOSIS — I739 Peripheral vascular disease, unspecified: Secondary | ICD-10-CM

## 2016-06-19 DIAGNOSIS — F01518 Vascular dementia, unspecified severity, with other behavioral disturbance: Secondary | ICD-10-CM

## 2016-06-19 DIAGNOSIS — F0151 Vascular dementia with behavioral disturbance: Secondary | ICD-10-CM | POA: Diagnosis not present

## 2016-07-17 ENCOUNTER — Encounter: Payer: Self-pay | Admitting: Adult Health

## 2016-07-17 ENCOUNTER — Non-Acute Institutional Stay (SKILLED_NURSING_FACILITY): Payer: Medicare Other | Admitting: Adult Health

## 2016-07-17 DIAGNOSIS — I10 Essential (primary) hypertension: Secondary | ICD-10-CM

## 2016-07-17 DIAGNOSIS — F0151 Vascular dementia with behavioral disturbance: Secondary | ICD-10-CM

## 2016-07-17 DIAGNOSIS — S72101D Unspecified trochanteric fracture of right femur, subsequent encounter for closed fracture with routine healing: Secondary | ICD-10-CM | POA: Diagnosis not present

## 2016-07-17 DIAGNOSIS — N400 Enlarged prostate without lower urinary tract symptoms: Secondary | ICD-10-CM | POA: Diagnosis not present

## 2016-07-17 DIAGNOSIS — I739 Peripheral vascular disease, unspecified: Secondary | ICD-10-CM

## 2016-07-17 DIAGNOSIS — F01518 Vascular dementia, unspecified severity, with other behavioral disturbance: Secondary | ICD-10-CM

## 2016-07-17 DIAGNOSIS — E440 Moderate protein-calorie malnutrition: Secondary | ICD-10-CM | POA: Diagnosis not present

## 2016-07-17 NOTE — Progress Notes (Signed)
Location:   [facility] Room Number: 140 Place of Service:  SNF (31)   CODE STATUS: dnr  No Known Allergies  Chief Complaint  Patient presents with  . Medical Management of Chronic Issues    Routine Visit    HPI:  He is a long term resident of this facility being seen for the management of his chronic illnesses. Overall there is little change in his status. He does get out of bed daily. He is unable to fully participate in the hpi or ros; but did tell me that he feels good. There are no nursing concerns at this time.   Past Medical History:  Diagnosis Date  . Arthritis   . Cancer ([place])    bladder  . Hypertension   . PAD (peripheral artery disease) ([place])   . PVD (peripheral vascular disease) ([place])   . S/P radiation therapy 02/15/2015 through 03/18/2015   Left pretibial region 4400 cGy in 10 sessions, two sessions a week for 5 weeks     Past Surgical History:  Procedure Laterality Date  . ACHILLES TENDON SURGERY      Social History   Social History  . Marital status: Married    Spouse name: N/A  . Number of children: N/A  . Years of education: N/A   Occupational History  . Not on file.   Social History Main Topics  . Smoking status: Current Some Day Smoker    Types: Pipe  . Smokeless tobacco: Never Used  . Alcohol use 1.2 oz/week    2 Glasses of wine per week  . Drug use: No  . Sexual activity: No   Other Topics Concern  . Not on file   Social History Narrative  . No narrative on file   Family History  Problem Relation Age of Onset  . Arthritis Mother   . Hearing loss Father       VITAL SIGNS BP 138/78   Pulse (!) 52   Temp 98 F (36.7 C)   Resp 18   Ht 5\' 10"  (1.778 m)   Wt 130 lb (59 kg)   SpO2 96%   BMI 18.65 kg/m   Patient's Medications  New Prescriptions   No medications on file  Previous Medications   ACETAMINOPHEN (TYLENOL) 500 MG TABLET    Take 1,000 mg by mouth every 8 (eight) hours as  needed for moderate pain.    AMINO ACIDS-PROTEIN HYDROLYS (FEEDING SUPPLEMENT, PRO-STAT SUGAR FREE 64,) LIQD    Take 30 mLs by mouth 2 (two) times daily.   BRIMONIDINE (ALPHAGAN) 0.15 % OPHTHALMIC SOLUTION    Place 1 drop into both eyes 2 (two) times daily.   BRINZOLAMIDE (AZOPT) 1 % OPHTHALMIC SUSPENSION    Place 1 drop into both eyes 3 (three) times daily.   CLOPIDOGREL (PLAVIX) 75 MG TABLET    Take 75 mg by mouth daily with breakfast.   FINASTERIDE (PROSCAR) 5 MG TABLET    Take 5 mg by mouth daily.   HYDRALAZINE (APRESOLINE) 10 MG TABLET    Take 5 mg by mouth 2 (two) times daily. TAKE 1/2 TABLET   KETOCONAZOLE, TOPICAL, 1 % SHAM    Apply topically. Apply every shift every Mon, Thu   LATANOPROST (XALATAN) 0.005 % OPHTHALMIC SOLUTION    Place 1 drop into both eyes at bedtime.   LISINOPRIL (PRINIVIL,ZESTRIL) 20 MG TABLET    Take 1 tablet (20 mg total) by mouth daily.   MEGARED OMEGA-3 KRILL OIL  500 MG CAPS    Take 1 tablet by mouth daily.   MULTIPLE VITAMINS-MINERALS (ICAPS AREDS 2 PO)    Take 1 tablet by mouth. Reported on 07/26/2015   NEOMY-BACIT-POLYMYX-PRAMOXINE 1 % OINT    Apply to Right hand topically daily every 3 days   SERTRALINE (ZOLOFT) 25 MG TABLET    Give 25 mg by mouth once daily as well as 37.5 mg by mouth once daily   TAMSULOSIN (FLOMAX) 0.4 MG CAPS CAPSULE    Take 0.4 mg by mouth daily after supper.   TRAMADOL (ULTRAM) 50 MG TABLET    Take 50 mg by mouth every 6 (six) hours.    UNABLE TO FIND    Med Pass 2.0 - Give 120 cc by mouth 3 times daily   UNABLE TO FIND    House Supplement - give 4 ounce 3 times daily with meals  Modified Medications   No medications on file  Discontinued Medications   No medications on file    SIGNIFICANT DIAGNOSTIC EXAMS  05-20-15: ct of head and cervical spine: No evidence of intracranial or cervical spine injury.  05-20-15: chest x-ray: No active disease.  05-20-15: ct of right hip: Right greater trochanter fracture with approximately 2 cm of  superior distraction. No intertrochanteric extension is identified but the patient is osteopenic and fractures can be occult on CT scan.  01-04-16: right shoulder x-ray; A C joint arthritis   LABS REVIEWED:   11-24-15: wbc 9.0; hgb 13.4; hct 40.1; mcv 96.2; plt 279; glucose 75; bun 19; creat 0.92; k+ 4.4; na++ 140; liver normal albumin 2.9  05-17-16: wbc 10.1; hgb 13.0; creat 38.5; mcv 99.7; plt 258; glucose 135; bun 25.3; creat 0.73; k+ 5.0; na++ 142; liver normal albumin 3.3     Review of Systems  Unable to perform ROS: other  hard of hearing   Physical Exam  Constitutional: No distress.  Eyes: Conjunctivae are normal.  Neck: Neck supple. No JVD present. No thyromegaly present.  Cardiovascular: Normal rate, regular rhythm and intact distal pulses.   Respiratory: Effort normal and breath sounds normal. No respiratory distress. He has no wheezes.  GI: Soft. Bowel sounds are normal. He exhibits no distension. There is no tenderness.  Musculoskeletal: He exhibits no edema.  Able to move all extremities  Is status post right hip fracture Jan 2017     Lymphadenopathy:   He has no cervical adenopathy.  Neurological: He is alert. Is easily confused  Skin: Skin is warm and dry. He is not diaphoretic.  Has venous changes in lower extremities Has scabbed area on left cheek Psychiatric: He has a normal mood and affect.     ASSESSMENT/ PLAN:  1. Hypertension: will continue  hydralazine 5 mg twice daily will continue lisinopril 20 mg daily and will hold blood pressure medications for systolic 123456    2. BPH: will continue flomax daily and proscar daily   3. PAD: will continue plavix 75 mg daily will monitor  4. Glaucoma: Alphagan 1 drop both eyes twice daily;  azopt to both eyes three times daily; xalatan to both eyes nightly  5. Right hip fracture: (jan 2017)   Will continue  ultram  50 mg  every 6 hours for chronic  pain management.   6. Malnutrition: albumin Jan 2018: 3.3    will  continue supplements per facility protocol; current weight is 138  pounds   7. Dementia: no significant change in status; is presently not on medications; will not  make changes will monitor     MD is aware of resident's narcotic use and is in agreement with current plan of care. We will attempt to wean resident as apropriate    Ok Edwards NP [facility]  Contact [phone] Monday through Friday 8am- 5pm  After hours call [phone]

## 2016-07-24 ENCOUNTER — Encounter: Payer: Self-pay | Admitting: Adult Health

## 2016-07-24 NOTE — Progress Notes (Signed)
Location:   [facility] of Service:   snf    CODE STATUS:  dnr  No Known Allergies   Chief Complaint  Patient presents with  . Medical Management of Chronic Issues     HPI:  He is a long term resident of this facility being seen for the management of his chronic illnesses. He has been agitated today with staff; he has scratched his mole on his face. He tells me that he feels good and has no concerns.  Past Medical History:  Diagnosis Date  . Arthritis   . Cancer ([place])    bladder  . Hypertension   . PAD (peripheral artery disease) ( [place])   . PVD (peripheral vascular disease) ([place])   . S/P radiation therapy 02/15/2015 through 03/18/2015   Left pretibial region 4400 cGy in 10 sessions, two sessions a week for 5 weeks     Past Surgical History:  Procedure Laterality Date  . ACHILLES TENDON SURGERY      Social History   Social History  . Marital status: Married    Spouse name: N/A  . Number of children: N/A  . Years of education: N/A   Occupational History  . Not on file.   Social History Main Topics  . Smoking status: Current Some Day Smoker    Types: Pipe  . Smokeless tobacco: Never Used  . Alcohol use 1.2 oz/week    2 Glasses of wine per week  . Drug use: No  . Sexual activity: No   Other Topics Concern  . Not on file   Social History Narrative  . No narrative on file   Family History  Problem Relation Age of Onset  . Arthritis Mother   . Hearing loss Father    Vitals:   06/19/16 1626  BP: (!) 148/68  Pulse: 60  Resp: 18  Temp: 97.5 F (36.4 C)  SpO2: 96%  Weight: 141 lb 12.8 oz (64.3 kg)  Height: 5\' 10"  (1.778 m)  ;  Patient's Medications  New Prescriptions   No medications on file  Previous Medications   ACETAMINOPHEN (TYLENOL) 500 MG TABLET    Take 1,000 mg by mouth every 8 (eight) hours as needed for moderate pain.    AMINO ACIDS-PROTEIN HYDROLYS (FEEDING SUPPLEMENT, PRO-STAT SUGAR FREE 64,) LIQD     Take 30 mLs by mouth 2 (two) times daily.   BRIMONIDINE (ALPHAGAN) 0.15 % OPHTHALMIC SOLUTION    Place 1 drop into both eyes 2 (two) times daily.   BRINZOLAMIDE (AZOPT) 1 % OPHTHALMIC SUSPENSION    Place 1 drop into both eyes 3 (three) times daily.   CLOPIDOGREL (PLAVIX) 75 MG TABLET    Take 75 mg by mouth daily with breakfast.   FINASTERIDE (PROSCAR) 5 MG TABLET    Take 5 mg by mouth daily.   HYDRALAZINE (APRESOLINE) 10 MG TABLET    Take 5 mg by mouth 2 (two) times daily. TAKE 1/2 TABLET   KETOCONAZOLE, TOPICAL, 1 % SHAM    Apply topically. Apply every shift every Mon, Thu   LATANOPROST (XALATAN) 0.005 % OPHTHALMIC SOLUTION    Place 1 drop into both eyes at bedtime.   LISINOPRIL (PRINIVIL,ZESTRIL) 20 MG TABLET    Take 1 tablet (20 mg total) by mouth daily.   MEGARED OMEGA-3 KRILL OIL 500 MG CAPS    Take 1 tablet by mouth daily.   MULTIPLE VITAMINS-MINERALS (ICAPS AREDS 2 PO)    Take 1 tablet by  mouth. Reported on 07/26/2015   NEOMY-BACIT-POLYMYX-PRAMOXINE 1 % OINT    Apply to Right hand topically daily every 3 days   SERTRALINE (ZOLOFT) 25 MG TABLET    Give 25 mg by mouth once daily as well as 37.5 mg by mouth once daily   TAMSULOSIN (FLOMAX) 0.4 MG CAPS CAPSULE    Take 0.4 mg by mouth daily after supper.   TRAMADOL (ULTRAM) 50 MG TABLET    Take 50 mg by mouth every 6 (six) hours.    UNABLE TO FIND    Med Pass 2.0 - Give 120 cc by mouth 3 times daily   UNABLE TO FIND    House Supplement - give 4 ounce 3 times daily with meals  Modified Medications   No medications on file  Discontinued Medications   No medications on file     SIGNIFICANT DIAGNOSTIC EXAMS  05-20-15: ct of head and cervical spine: No evidence of intracranial or cervical spine injury.  05-20-15: chest x-ray: No active disease.  05-20-15: ct of right hip: Right greater trochanter fracture with approximately 2 cm of superior distraction. No intertrochanteric extension is identified but the patient is osteopenic and fractures  can be occult on CT scan.  01-04-16: right shoulder x-ray; A C joint arthritis   LABS REVIEWED:   11-24-15: wbc 9.0; hgb 13.4; hct 40.1; mcv 96.2; plt 279; glucose 75; bun 19; creat 0.92; k+ 4.4; na++ 140; liver normal albumin 2.9  05-17-16: wbc 10.1; hgb 13.0; hct 38.5; mcv 99.7; plt 258; glucose 135; bun 25.3; creat 0.73; k+ 5.0; na++ 142; liver normal albumin 2.3    Review of Systems  Unable to perform ROS: Dementia      Physical Exam  Constitutional: No distress.  Eyes: Conjunctivae are normal.  Neck: Neck supple. No JVD present. No thyromegaly present.  Cardiovascular: Normal rate, regular rhythm and intact distal pulses.   Respiratory: Effort normal and breath sounds normal. No respiratory distress. He has no wheezes.  GI: Soft. Bowel sounds are normal. He exhibits no distension. There is no tenderness.  Musculoskeletal: He exhibits no edema.  Able to move all extremities  Is status post right hip fracture Jan 2017     Lymphadenopathy:   He has no cervical adenopathy.  Neurological: He is alert. Is easily confused  Skin: Skin is warm and dry. He is not diaphoretic.  Has venous changes in lower extremities Has scabbed area on left cheek Psychiatric: He has a normal mood and affect.     ASSESSMENT/ PLAN:  1. Hypertension: will continue  hydralazine 5 mg twice daily will continue lisinopril 20 mg daily and will hold blood pressure medications for systolic <161    2. BPH: will continue flomax daily and proscar daily   3. PAD: will continue plavix 75 mg daily will monitor  4. Glaucoma: Alphagan 1 drop both eyes twice daily;  azopt to both eyes three times daily; xalatan to both eyes nightly  5. Chronic pain: related to right hip fracture: (jan 2017)   Will continue  ultram  50 mg  every 6 hours .  6. Malnutrition: albumin July 2017: 2.3   will continue supplements per facility protocol; current weight is 141  pounds   7. Anxiety: will begin ativan 0.5 mg every 8 hours  as needed for the next 2 weeks. Will monitor his status.   8. Dementia: no significant change in status; is currently not on medications; will monitor       MD  is aware of resident's narcotic use and is in agreement with current plan of care. We will attempt to wean resident as apropriate   Ok Edwards NP [facility]  Contact [phone] Monday through Friday 8am- 5pm  After hours call [phone]

## 2016-08-14 ENCOUNTER — Encounter: Payer: Self-pay | Admitting: Adult Health

## 2016-08-14 ENCOUNTER — Non-Acute Institutional Stay (SKILLED_NURSING_FACILITY): Payer: Medicare Other | Admitting: Adult Health

## 2016-08-14 DIAGNOSIS — I739 Peripheral vascular disease, unspecified: Secondary | ICD-10-CM | POA: Diagnosis not present

## 2016-08-14 DIAGNOSIS — F0151 Vascular dementia with behavioral disturbance: Secondary | ICD-10-CM

## 2016-08-14 DIAGNOSIS — F329 Major depressive disorder, single episode, unspecified: Secondary | ICD-10-CM | POA: Diagnosis not present

## 2016-08-14 DIAGNOSIS — I10 Essential (primary) hypertension: Secondary | ICD-10-CM | POA: Diagnosis not present

## 2016-08-14 DIAGNOSIS — N400 Enlarged prostate without lower urinary tract symptoms: Secondary | ICD-10-CM | POA: Diagnosis not present

## 2016-08-14 DIAGNOSIS — E440 Moderate protein-calorie malnutrition: Secondary | ICD-10-CM

## 2016-08-14 DIAGNOSIS — F01518 Vascular dementia, unspecified severity, with other behavioral disturbance: Secondary | ICD-10-CM

## 2016-08-14 DIAGNOSIS — F32A Depression, unspecified: Secondary | ICD-10-CM

## 2016-08-14 NOTE — Progress Notes (Signed)
Location:   [place] Room Number: 140 A Place of Service:  SNF (31)   CODE STATUS:  DNR  No Known Allergies  Chief Complaint  Patient presents with  . Medical Management of Chronic Issues    1 month follow up    HPI:  He is a long term resident of this facility being seen for the management of his chronic illnesses. Overall his status is without significant change. He is unable to fully participate in the hpi or ros. There are no nursing concerns at this time.    Past Medical History:  Diagnosis Date  . Arthritis   . Cancer ([place])    bladder  . Hypertension   . PAD (peripheral artery disease) ([place])   . PVD (peripheral vascular disease) ([place])   . S/P radiation therapy 02/15/2015 through 03/18/2015   Left pretibial region 4400 cGy in 10 sessions, two sessions a week for 5 weeks     Past Surgical History:  Procedure Laterality Date  . ACHILLES TENDON SURGERY      Social History   Social History  . Marital status: Married    Spouse name: N/A  . Number of children: N/A  . Years of education: N/A   Occupational History  . Not on file.   Social History Main Topics  . Smoking status: Current Some Day Smoker    Types: Pipe  . Smokeless tobacco: Never Used  . Alcohol use 1.2 oz/week    2 Glasses of wine per week  . Drug use: No  . Sexual activity: No   Other Topics Concern  . Not on file   Social History Narrative  . No narrative on file   Family History  Problem Relation Age of Onset  . Arthritis Mother   . Hearing loss Father       VITAL SIGNS BP (!) 144/76   Pulse (!) 59   Temp 97.7 F (36.5 C)   Resp 18   Ht 5\' 10"  (1.778 m)   Wt 138 lb (62.6 kg)   SpO2 95%   BMI 19.80 kg/m   Patient's Medications  New Prescriptions   No medications on file  Previous Medications   ACETAMINOPHEN (TYLENOL) 500 MG TABLET    Take 1,000 mg by mouth every 8 (eight) hours as needed for moderate pain.    AMINO  ACIDS-PROTEIN HYDROLYS (FEEDING SUPPLEMENT, PRO-STAT SUGAR FREE 64,) LIQD    Take 30 mLs by mouth 2 (two) times daily.   BRIMONIDINE (ALPHAGAN) 0.15 % OPHTHALMIC SOLUTION    Place 1 drop into both eyes 2 (two) times daily.   BRINZOLAMIDE (AZOPT) 1 % OPHTHALMIC SUSPENSION    Place 1 drop into both eyes 3 (three) times daily.   CLOPIDOGREL (PLAVIX) 75 MG TABLET    Take 75 mg by mouth daily with breakfast.   FINASTERIDE (PROSCAR) 5 MG TABLET    Take 5 mg by mouth daily.   HYDRALAZINE (APRESOLINE) 10 MG TABLET    Take 5 mg by mouth 2 (two) times daily. TAKE 1/2 TABLET   KETOCONAZOLE, TOPICAL, 1 % SHAM    Apply topically. Apply every shift every Mon, Thu   LATANOPROST (XALATAN) 0.005 % OPHTHALMIC SOLUTION    Place 1 drop into both eyes at bedtime.   LISINOPRIL (PRINIVIL,ZESTRIL) 20 MG TABLET    Take 1 tablet (20 mg total) by mouth daily.   MEGARED OMEGA-3 KRILL OIL 500 MG CAPS    Take 1 tablet by mouth  daily.   MULTIPLE VITAMINS-MINERALS (ICAPS AREDS 2 PO)    Take 1 tablet by mouth daily. Reported on 07/26/2015   NEOMY-BACIT-POLYMYX-PRAMOXINE 1 % OINT    Apply to Right hand topically daily every 3 days   SERTRALINE (ZOLOFT) 25 MG TABLET    Give  37.5 mg by mouth once daily   TAMSULOSIN (FLOMAX) 0.4 MG CAPS CAPSULE    Take 0.4 mg by mouth daily after supper.   TRAMADOL (ULTRAM) 50 MG TABLET    Take 50 mg by mouth every 6 (six) hours.    UNABLE TO FIND    Med Pass 2.0 - Give 120 cc by mouth 3 times daily   UNABLE TO FIND    House Supplement - give 4 ounce 3 times daily with meals  Modified Medications   No medications on file  Discontinued Medications   No medications on file     SIGNIFICANT DIAGNOSTIC EXAMS    05-20-15: ct of head and cervical spine: No evidence of intracranial or cervical spine injury.  05-20-15: chest x-ray: No active disease.  05-20-15: ct of right hip: Right greater trochanter fracture with approximately 2 cm of superior distraction. No intertrochanteric extension is  identified but the patient is osteopenic and fractures can be occult on CT scan.  01-04-16: right shoulder x-ray; A C joint arthritis   LABS REVIEWED:   11-24-15: wbc 9.0; hgb 13.4; hct 40.1; mcv 96.2; plt 279; glucose 75; bun 19; creat 0.92; k+ 4.4; na++ 140; liver normal albumin 2.9  05-17-16: wbc 10.1; hgb 13.0; creat 38.5; mcv 99.7; plt 258; glucose 135; bun 25.3; creat 0.73; k+ 5.0; na++ 142; liver normal albumin 3.3     Review of Systems  Unable to perform ROS: other  hard of hearing   Physical Exam  Constitutional: No distress.  Eyes: Conjunctivae are normal.  Neck: Neck supple. No JVD present. No thyromegaly present.  Cardiovascular: Normal rate, regular rhythm and intact distal pulses.   Respiratory: Effort normal and breath sounds normal. No respiratory distress. He has no wheezes.  GI: Soft. Bowel sounds are normal. He exhibits no distension. There is no tenderness.  Musculoskeletal: He exhibits no edema.  Able to move all extremities  Is status post right hip fracture Jan 2017     Lymphadenopathy:   He has no cervical adenopathy.  Neurological: He is alert. Is easily confused  Skin: Skin is warm and dry. He is not diaphoretic.  Has venous changes in lower extremities Psychiatric: He has a normal mood and affect.     ASSESSMENT/ PLAN:  1. Hypertension: will continue  hydralazine 5 mg twice daily will continue lisinopril 20 mg daily and will hold blood pressure medications for systolic <939    2. BPH: will continue flomax daily and proscar daily   3. PAD: will continue plavix 75 mg daily will monitor  4. Glaucoma: Alphagan 1 drop both eyes twice daily;  azopt to both eyes three times daily; xalatan to both eyes nightly  5. Right hip fracture: (jan 2017)   Will continue  ultram  50 mg  every 6 hours for chronic  pain management.   6. Malnutrition: albumin Jan 2018: 3.3    will continue supplements per facility protocol; current weight is 138  pounds   7.  Dementia: no significant change in status; is presently not on medications; will not make changes will monitor   8. Depression: will continue zoloft 37.5 mg daily    MD is aware of  resident's narcotic use and is in agreement with current plan of care. We will attempt to wean resident as apropriate   Ok Edwards NP [facility]  Contact [phone] Monday through Friday 8am- 5pm  After hours call [phone]

## 2016-08-31 ENCOUNTER — Encounter: Payer: Self-pay | Admitting: Adult Health

## 2016-08-31 ENCOUNTER — Non-Acute Institutional Stay (SKILLED_NURSING_FACILITY): Payer: Medicare Other | Admitting: Adult Health

## 2016-08-31 DIAGNOSIS — F01518 Vascular dementia, unspecified severity, with other behavioral disturbance: Secondary | ICD-10-CM

## 2016-08-31 DIAGNOSIS — S72101D Unspecified trochanteric fracture of right femur, subsequent encounter for closed fracture with routine healing: Secondary | ICD-10-CM

## 2016-08-31 DIAGNOSIS — F0151 Vascular dementia with behavioral disturbance: Secondary | ICD-10-CM

## 2016-08-31 DIAGNOSIS — R627 Adult failure to thrive: Secondary | ICD-10-CM

## 2016-08-31 NOTE — Progress Notes (Signed)
Location:   [place] Room Number: 140 A Place of Service:  SNF (31)   CODE STATUS: DNR  No Known Allergies  Chief Complaint  Patient presents with  . Acute Visit    Family Care Planning    HPI:  His family has requested a care plan meeting. They feel as though their father is declining. They do not any aggressive care to be done. They desire only for comfort care. They would like hospice brought on board. They would also like to have his medications stopped at this time.   Past Medical History:  Diagnosis Date  . Arthritis   . Cancer ([place])    bladder  . Hypertension   . PAD (peripheral artery disease) ([place])   . PVD (peripheral vascular disease) ([place])   . S/P radiation therapy 02/15/2015 through 03/18/2015   Left pretibial region 4400 cGy in 10 sessions, two sessions a week for 5 weeks     Past Surgical History:  Procedure Laterality Date  . ACHILLES TENDON SURGERY      Social History   Social History  . Marital status: Married    Spouse name: N/A  . Number of children: N/A  . Years of education: N/A   Occupational History  . Not on file.   Social History Main Topics  . Smoking status: Current Some Day Smoker    Types: Pipe  . Smokeless tobacco: Never Used  . Alcohol use 1.2 oz/week    2 Glasses of wine per week  . Drug use: No  . Sexual activity: No   Other Topics Concern  . Not on file   Social History Narrative  . No narrative on file   Family History  Problem Relation Age of Onset  . Arthritis Mother   . Hearing loss Father       VITAL SIGNS BP 136/76   Pulse (!) 53   Temp 97.9 F (36.6 C)   Resp 18   Ht 5\' 10"  (1.778 m)   Wt 138 lb (62.6 kg)   SpO2 97%   BMI 19.80 kg/m   Patient's Medications  New Prescriptions   No medications on file  Previous Medications   ACETAMINOPHEN (TYLENOL) 500 MG TABLET    Take 1,000 mg by mouth every 8 (eight) hours as needed for moderate pain.    AMINO  ACIDS-PROTEIN HYDROLYS (FEEDING SUPPLEMENT, PRO-STAT SUGAR FREE 64,) LIQD    Take 30 mLs by mouth 2 (two) times daily.   BRIMONIDINE (ALPHAGAN) 0.15 % OPHTHALMIC SOLUTION    Place 1 drop into both eyes 2 (two) times daily.   BRINZOLAMIDE (AZOPT) 1 % OPHTHALMIC SUSPENSION    Place 1 drop into both eyes 3 (three) times daily.   CLOPIDOGREL (PLAVIX) 75 MG TABLET    Take 75 mg by mouth daily with breakfast.   FINASTERIDE (PROSCAR) 5 MG TABLET    Take 5 mg by mouth daily.   HYDRALAZINE (APRESOLINE) 10 MG TABLET    Take 5 mg by mouth 2 (two) times daily. TAKE 1/2 TABLET   KETOCONAZOLE, TOPICAL, 1 % SHAM    Apply topically. Apply every shift every Mon, Thu   LATANOPROST (XALATAN) 0.005 % OPHTHALMIC SOLUTION    Place 1 drop into both eyes at bedtime.   LISINOPRIL (PRINIVIL,ZESTRIL) 20 MG TABLET    Take 1 tablet (20 mg total) by mouth daily.   MEGARED OMEGA-3 KRILL OIL 500 MG CAPS    Take 1 tablet by mouth daily.  MULTIPLE VITAMINS-MINERALS (ICAPS AREDS 2 PO)    Take 1 tablet by mouth daily. Reported on 07/26/2015   SERTRALINE (ZOLOFT) 25 MG TABLET    Give  37.5 mg by mouth once daily   TAMSULOSIN (FLOMAX) 0.4 MG CAPS CAPSULE    Take 0.4 mg by mouth daily after supper.   TRAMADOL (ULTRAM) 50 MG TABLET    Take 50 mg by mouth every 6 (six) hours.    UNABLE TO FIND    Med Pass 2.0 - Give 120 cc by mouth 3 times daily   UNABLE TO FIND    House Supplement - give 4 ounce 3 times daily with meals  Modified Medications   No medications on file  Discontinued Medications   NEOMY-BACIT-POLYMYX-PRAMOXINE 1 % OINT    Apply to Right hand topically daily every 3 days     SIGNIFICANT DIAGNOSTIC EXAMS  05-20-15: ct of head and cervical spine: No evidence of intracranial or cervical spine injury.  05-20-15: chest x-ray: No active disease.  05-20-15: ct of right hip: Right greater trochanter fracture with approximately 2 cm of superior distraction. No intertrochanteric extension is identified but the patient is  osteopenic and fractures can be occult on CT scan.  01-04-16: right shoulder x-ray; A C joint arthritis   LABS REVIEWED:   11-24-15: wbc 9.0; hgb 13.4; hct 40.1; mcv 96.2; plt 279; glucose 75; bun 19; creat 0.92; k+ 4.4; na++ 140; liver normal albumin 2.9  05-17-16: wbc 10.1; hgb 13.0; creat 38.5; mcv 99.7; plt 258; glucose 135; bun 25.3; creat 0.73; k+ 5.0; na++ 142; liver normal albumin 3.3     Review of Systems  Unable to perform ROS: other  hard of hearing   Physical Exam  Constitutional: No distress.  Eyes: Conjunctivae are normal.  Neck: Neck supple. No JVD present. No thyromegaly present.  Cardiovascular: Normal rate, regular rhythm and intact distal pulses.   Respiratory: Effort normal and breath sounds normal. No respiratory distress. Marland Kitchen  GI: Soft. Bowel sounds are normal. He exhibits no distension.  Musculoskeletal: He exhibits no edema.  Able to move all extremities  Is status post right hip fracture Jan 2017     Lymphadenopathy:   He has no cervical adenopathy.  Neurological: He is alert. Is easily confused  Skin: Skin is warm and dry. He is not diaphoretic.  Has venous changes in lower extremities     ASSESSMENT/ PLAN:  1. Failure to thrive 2. Dementia 3. Status post right hip fracture  Will setup hospice consult Will stop the following medications: prostat, plavix; proscar; apresoline; lisinopril; fish oil; mvi; ultram Will begin roxanol 5 mg veery 6 hours routinely and every 3 hours as needed  Time spent with patient  45  minutes >50% time spent counseling; reviewing medical record; tests; labs; and developing future plan of care   MD is aware of resident's narcotic use and is in agreement with current plan of care. We will attempt to wean resident as apropriate   Ok Edwards NP [facility]  Contact [phone] Monday through Friday 8am- 5pm  After hours call [phone]

## 2016-09-16 DIAGNOSIS — R627 Adult failure to thrive: Secondary | ICD-10-CM | POA: Insufficient documentation

## 2016-10-13 DEATH — deceased
